# Patient Record
Sex: Female | Born: 1950 | Race: White | Hispanic: No | Marital: Married | Smoking: Never smoker
Health system: Southern US, Community
[De-identification: ages and names within clinical notes are randomized; demographics above are authoritative.]

## PROBLEM LIST (undated history)

## (undated) DIAGNOSIS — I341 Nonrheumatic mitral (valve) prolapse: Secondary | ICD-10-CM

## (undated) DIAGNOSIS — E785 Hyperlipidemia, unspecified: Secondary | ICD-10-CM

## (undated) DIAGNOSIS — I340 Nonrheumatic mitral (valve) insufficiency: Secondary | ICD-10-CM

## (undated) DIAGNOSIS — I471 Supraventricular tachycardia, unspecified: Secondary | ICD-10-CM

## (undated) HISTORY — DX: Nonrheumatic mitral (valve) insufficiency: I34.0

## (undated) HISTORY — DX: Nonrheumatic mitral (valve) prolapse: I34.1

## (undated) HISTORY — DX: Hyperlipidemia, unspecified: E78.5

## (undated) HISTORY — DX: Supraventricular tachycardia, unspecified: I47.10

## (undated) HISTORY — DX: Supraventricular tachycardia: I47.1

---

## 1998-08-19 ENCOUNTER — Other Ambulatory Visit: Admission: RE | Admit: 1998-08-19 | Discharge: 1998-08-19 | Payer: Self-pay | Admitting: Obstetrics and Gynecology

## 1999-10-03 ENCOUNTER — Other Ambulatory Visit: Admission: RE | Admit: 1999-10-03 | Discharge: 1999-10-03 | Payer: Self-pay | Admitting: Obstetrics and Gynecology

## 2000-10-15 ENCOUNTER — Other Ambulatory Visit: Admission: RE | Admit: 2000-10-15 | Discharge: 2000-10-15 | Payer: Self-pay | Admitting: Obstetrics and Gynecology

## 2000-10-18 ENCOUNTER — Other Ambulatory Visit: Admission: RE | Admit: 2000-10-18 | Discharge: 2000-10-18 | Payer: Self-pay | Admitting: Obstetrics and Gynecology

## 2000-10-18 ENCOUNTER — Encounter (INDEPENDENT_AMBULATORY_CARE_PROVIDER_SITE_OTHER): Payer: Self-pay | Admitting: Specialist

## 2000-10-25 ENCOUNTER — Encounter: Payer: Self-pay | Admitting: Obstetrics and Gynecology

## 2000-10-25 ENCOUNTER — Encounter: Admission: RE | Admit: 2000-10-25 | Discharge: 2000-10-25 | Payer: Self-pay | Admitting: Obstetrics and Gynecology

## 2001-11-10 ENCOUNTER — Other Ambulatory Visit: Admission: RE | Admit: 2001-11-10 | Discharge: 2001-11-10 | Payer: Self-pay | Admitting: Obstetrics and Gynecology

## 2002-12-13 ENCOUNTER — Other Ambulatory Visit: Admission: RE | Admit: 2002-12-13 | Discharge: 2002-12-13 | Payer: Self-pay | Admitting: Obstetrics and Gynecology

## 2003-12-27 ENCOUNTER — Other Ambulatory Visit: Admission: RE | Admit: 2003-12-27 | Discharge: 2003-12-27 | Payer: Self-pay | Admitting: Obstetrics and Gynecology

## 2004-04-09 ENCOUNTER — Ambulatory Visit: Payer: Self-pay | Admitting: *Deleted

## 2004-12-24 ENCOUNTER — Ambulatory Visit: Payer: Self-pay | Admitting: *Deleted

## 2005-02-04 ENCOUNTER — Other Ambulatory Visit: Admission: RE | Admit: 2005-02-04 | Discharge: 2005-02-04 | Payer: Self-pay | Admitting: Obstetrics and Gynecology

## 2005-05-13 ENCOUNTER — Ambulatory Visit: Payer: Self-pay | Admitting: *Deleted

## 2005-06-03 ENCOUNTER — Ambulatory Visit: Payer: Self-pay

## 2005-06-03 ENCOUNTER — Ambulatory Visit: Payer: Self-pay | Admitting: *Deleted

## 2005-06-03 ENCOUNTER — Encounter: Payer: Self-pay | Admitting: Cardiology

## 2006-01-15 ENCOUNTER — Ambulatory Visit: Payer: Self-pay | Admitting: *Deleted

## 2006-07-16 ENCOUNTER — Ambulatory Visit: Payer: Self-pay | Admitting: *Deleted

## 2006-07-16 LAB — CONVERTED CEMR LAB: Total CK: 305 units/L (ref 7–177)

## 2006-09-02 ENCOUNTER — Ambulatory Visit: Payer: Self-pay | Admitting: Cardiology

## 2006-10-08 ENCOUNTER — Ambulatory Visit: Payer: Self-pay | Admitting: *Deleted

## 2006-10-08 LAB — CONVERTED CEMR LAB
ALT: 21 units/L (ref 0–40)
AST: 25 units/L (ref 0–37)
Albumin: 3.7 g/dL (ref 3.5–5.2)
Alkaline Phosphatase: 49 units/L (ref 39–117)
Bilirubin, Direct: 0.1 mg/dL (ref 0.0–0.3)
Cholesterol: 164 mg/dL (ref 0–200)
HDL: 65.2 mg/dL (ref >39.0)
LDL Cholesterol: 82 mg/dL (ref 0–99)
Total Bilirubin: 0.8 mg/dL (ref 0.3–1.2)
Total CHOL/HDL Ratio: 2.5
Total CK: 249 units/L (ref 7–177)
Total Protein: 6.4 g/dL (ref 6.0–8.3)
Triglycerides: 83 mg/dL (ref 0–149)
VLDL: 17 mg/dL (ref 0–40)

## 2006-10-14 ENCOUNTER — Ambulatory Visit: Payer: Self-pay | Admitting: Cardiology

## 2006-10-19 ENCOUNTER — Ambulatory Visit: Payer: Self-pay | Admitting: *Deleted

## 2007-01-10 ENCOUNTER — Ambulatory Visit (HOSPITAL_COMMUNITY): Admission: RE | Admit: 2007-01-10 | Discharge: 2007-01-10 | Payer: Self-pay | Admitting: Gastroenterology

## 2007-02-11 ENCOUNTER — Ambulatory Visit: Payer: Self-pay | Admitting: *Deleted

## 2007-02-11 LAB — CONVERTED CEMR LAB
ALT: 23 units/L (ref 0–35)
AST: 27 units/L (ref 0–37)
Albumin: 3.7 g/dL (ref 3.5–5.2)
Alkaline Phosphatase: 54 units/L (ref 39–117)
Bilirubin, Direct: 0.1 mg/dL (ref 0.0–0.3)
Cholesterol: 151 mg/dL (ref 0–200)
HDL: 65 mg/dL (ref >39.0)
LDL Cholesterol: 74 mg/dL (ref 0–99)
Total Bilirubin: 0.8 mg/dL (ref 0.3–1.2)
Total CHOL/HDL Ratio: 2.3
Total CK: 326 units/L (ref 7–177)
Total Protein: 6.6 g/dL (ref 6.0–8.3)
Triglycerides: 58 mg/dL (ref 0–149)
VLDL: 12 mg/dL (ref 0–40)

## 2007-02-17 ENCOUNTER — Ambulatory Visit: Payer: Self-pay | Admitting: Cardiology

## 2007-10-14 ENCOUNTER — Ambulatory Visit: Payer: Self-pay | Admitting: Cardiology

## 2007-10-14 LAB — CONVERTED CEMR LAB
ALT: 19 units/L (ref 0–35)
AST: 25 units/L (ref 0–37)
Albumin: 3.7 g/dL (ref 3.5–5.2)
Alkaline Phosphatase: 56 units/L (ref 39–117)
Bilirubin, Direct: 0.1 mg/dL (ref 0.0–0.3)
Cholesterol: 169 mg/dL (ref 0–200)
HDL: 71.6 mg/dL (ref >39.0)
LDL Cholesterol: 84 mg/dL (ref 0–99)
Total Bilirubin: 0.9 mg/dL (ref 0.3–1.2)
Total CHOL/HDL Ratio: 2.4
Total Protein: 6.6 g/dL (ref 6.0–8.3)
Triglycerides: 66 mg/dL (ref 0–149)
VLDL: 13 mg/dL (ref 0–40)

## 2007-10-19 ENCOUNTER — Ambulatory Visit: Payer: Self-pay | Admitting: Cardiology

## 2008-07-02 DIAGNOSIS — I080 Rheumatic disorders of both mitral and aortic valves: Secondary | ICD-10-CM

## 2008-07-02 DIAGNOSIS — E785 Hyperlipidemia, unspecified: Secondary | ICD-10-CM

## 2008-07-02 DIAGNOSIS — I471 Supraventricular tachycardia: Secondary | ICD-10-CM

## 2008-10-02 ENCOUNTER — Telehealth: Payer: Self-pay | Admitting: Cardiology

## 2008-10-19 ENCOUNTER — Ambulatory Visit: Payer: Self-pay | Admitting: Cardiology

## 2008-10-19 LAB — CONVERTED CEMR LAB
ALT: 25 units/L (ref 0–35)
AST: 25 units/L (ref 0–37)
Albumin: 3.7 g/dL (ref 3.5–5.2)
Alkaline Phosphatase: 60 units/L (ref 39–117)
Bilirubin, Direct: 0.2 mg/dL (ref 0.0–0.3)
Cholesterol: 164 mg/dL (ref 0–200)
HDL: 73.6 mg/dL (ref >39.00)
LDL Cholesterol: 80 mg/dL (ref 0–99)
Total Bilirubin: 0.9 mg/dL (ref 0.3–1.2)
Total CHOL/HDL Ratio: 2
Total Protein: 6.6 g/dL (ref 6.0–8.3)
Triglycerides: 54 mg/dL (ref 0.0–149.0)
VLDL: 10.8 mg/dL (ref 0.0–40.0)

## 2008-10-22 ENCOUNTER — Encounter (INDEPENDENT_AMBULATORY_CARE_PROVIDER_SITE_OTHER): Payer: Self-pay | Admitting: *Deleted

## 2008-11-21 ENCOUNTER — Ambulatory Visit: Payer: Self-pay | Admitting: Cardiology

## 2008-11-21 ENCOUNTER — Encounter (INDEPENDENT_AMBULATORY_CARE_PROVIDER_SITE_OTHER): Payer: Self-pay | Admitting: *Deleted

## 2008-11-21 ENCOUNTER — Encounter: Payer: Self-pay | Admitting: Cardiology

## 2008-11-21 DIAGNOSIS — Z8679 Personal history of other diseases of the circulatory system: Secondary | ICD-10-CM

## 2009-11-14 ENCOUNTER — Ambulatory Visit: Payer: Self-pay | Admitting: Cardiology

## 2009-11-18 ENCOUNTER — Encounter (INDEPENDENT_AMBULATORY_CARE_PROVIDER_SITE_OTHER): Payer: Self-pay | Admitting: *Deleted

## 2009-11-18 LAB — CONVERTED CEMR LAB
ALT: 24 units/L (ref 0–35)
AST: 27 units/L (ref 0–37)
Albumin: 4 g/dL (ref 3.5–5.2)
Alkaline Phosphatase: 59 units/L (ref 39–117)
Bilirubin, Direct: 0.1 mg/dL (ref 0.0–0.3)
Cholesterol: 172 mg/dL (ref 0–200)
HDL: 75.4 mg/dL (ref >39.00)
LDL Cholesterol: 78 mg/dL (ref 0–99)
Total Bilirubin: 0.8 mg/dL (ref 0.3–1.2)
Total CHOL/HDL Ratio: 2
Total Protein: 6.4 g/dL (ref 6.0–8.3)
Triglycerides: 93 mg/dL (ref 0.0–149.0)
VLDL: 18.6 mg/dL (ref 0.0–40.0)

## 2009-11-20 ENCOUNTER — Encounter: Payer: Self-pay | Admitting: Cardiology

## 2009-11-20 ENCOUNTER — Ambulatory Visit: Payer: Self-pay | Admitting: Cardiology

## 2010-07-08 NOTE — Assessment & Plan Note (Signed)
Summary: Holly Pratt   Visit Type:  1 year follow up  CC:  irregular heart beat sometimes.  History of Present Illness: Holly Pratt is a very pleasant  female who  has a history of question of mitral valve prolapse, as well as supraventricular tachycardia that was diagnosed in the 1970s.  She did have her last echocardiogram performed on June 03, 2005.  Her LV function was normal.  There was no mitral valve prolapse, but there was mild mitral regurgitation.  The left atrium is mildly dilated.  There was trivial tricuspid regurgitation. She apparently had a nonischemic stress Myoview performed in 1999. I last saw her in June of 2010. Since then she is doing well from a symptomatic standpoint. There is no dyspnea on exertion, orthopnea, PND, pedal edema, exertional chest pain or syncope. She does continue to have occasional palpitations that are described as a skip. They're nonsustained. The longest episode was 5 seconds. There is no associated symptoms and no syncope.They are not particularly bothersome nor are they any worse since I last saw her.  Current Medications (verified): 1)  Propranolol Hcl 10 Mg Tabs (Propranolol Hcl) .... Take 1 Tablet By Mouth Two Times A Day 2)  Zocor 40 Mg Tabs (Simvastatin) .... Take 1 Tablet By Mouth Once A Day 3)  Fish Oil 1200 Mg Caps (Omega-3 Fatty Acids) .... Take 1 Capsule By Mouth Once A Day 4)  Aspirin 81 Mg Tbec (Aspirin) .... Take One Tablet By Mouth Daily 5)  Calcium Carbonate 600 Mg Tabs (Calcium Carbonate) .... Take 1 Tablet By Mouth Two Times A Day  Allergies (verified): No Known Drug Allergies  Past History:  Past Medical History: HYPERLIPIDEMIA-MIXED (ICD-272.4) MITRAL REGURGITATION, MILD (ICD-396.3) SVT (ICD-427.0) mitral valve prolapse    Social History: Reviewed history from 07/02/2008 and no changes required. Full Time Married  Tobacco Use - No.  Alcohol Use - yes  Review of Systems       Some hand arthralgias but  no fevers or chills, productive cough, hemoptysis, dysphasia, odynophagia, melena, hematochezia, dysuria, hematuria, rash, seizure activity, orthopnea, PND, pedal edema, claudication. Remaining systems are negative.   Vital Signs:  Patient profile:   60 year old female Height:      64 inches Weight:      171.25 pounds BMI:     29.50 Pulse rate:   61 / minute Pulse rhythm:   regular Resp:     18 per minute BP sitting:   138 / 74  (right arm) Cuff size:   large  Vitals Entered By: Holly Pratt (November 20, 2009 1:55 PM)  Physical Exam  General:  Well-developed well-nourished in no acute distress.  Skin is warm and dry.  HEENT is normal.  Neck is supple. No thyromegaly.  Chest is clear to auscultation with normal expansion.  Cardiovascular exam is regular rate and rhythm.  Abdominal exam nontender or distended. No masses palpated. Extremities show no edema. neuro grossly intact    EKG  Procedure date:  11/20/2009  Findings:      Normal sinus rhythm at a rate of 64. Axis normal. No significant ST changes.  Impression & Recommendations:  Problem # 1:  SVT/ PSVT/ PAT (ICD-427.0) Occasional brief skips but no sustained palpitations. Continue propranolol. This dose could be increased in the future if her palpitations worsen. If she has sustained arrhythmias future then we could refer her to EP for consideration of ablation. Her updated medication list for this problem includes:  Propranolol Hcl 10 Mg Tabs (Propranolol hcl) .Marland Kitchen... Take 1 tablet by mouth two times a day    Aspirin 81 Mg Tbec (Aspirin) .Marland Kitchen... Take one tablet by mouth daily  Problem # 2:  HYPERLIPIDEMIA-MIXED (ICD-272.4) Continue statin. Recent lipids and liver outstanding. Her updated medication list for this problem includes:    Zocor 40 Mg Tabs (Simvastatin) .Marland Kitchen... Take 1 tablet by mouth once a day  Problem # 3:  MITRAL REGURGITATION, MILD (ICD-396.3) Last echocardiogram showed only mild mitral regurgitation  and I did not hear a significant murmur on examination. No mitral valve prolapse on last echocardiogram. No further workup at this time.  Patient Instructions: 1)  Your physician recommends that you schedule a follow-up appointment in: ONE YEAR

## 2010-07-08 NOTE — Letter (Signed)
Summary: [facility] - Lipid  [facility], Main Office  [address]   [place], [place] [address]   Phone: [phone]  Fax: [phone]     November 18, 2009 MRN: [id number]   Holly Pratt [address] [place], [place]  [address]   Dear [name],  We have reviewed your cholesterol results.  They are as follows:     Total Cholesterol:    172 (Desirable: less than 200)       HDL  Cholesterol:     75.40  (Desirable: greater than 40 for men and 50 for women)       LDL Cholesterol:       78  (Desirable: less than 100 for low risk and less than 70 for moderate to high risk)       Triglycerides:       93.0  (Desirable: less than 150)  Our recommendations include:These numbers look good. Continue on the same medicine. Liver function is normal. Take care, Dr. Darel Hong.    Call our office at the number listed above if you have any questions.  Lowering your LDL cholesterol is important, but it is only one of a large number of "risk factors" that may indicate that you are at risk for heart disease, stroke or other complications of hardening of the arteries.  Other risk factors include:   A.  Cigarette Smoking* B.  High Blood Pressure* C.  Obesity* D.   Low HDL Cholesterol (see yours above)* E.   Diabetes Mellitus (higher risk if your is uncontrolled) F.  Family history of premature heart disease G.  Previous history of stroke or cardiovascular disease    *These are risk factors YOU HAVE CONTROL OVER.  For more information, visit .  There is now evidence that lowering the TOTAL CHOLESTEROL AND LDL CHOLESTEROL can reduce the risk of heart disease.  The American Heart Association recommends the following guidelines for the treatment of elevated cholesterol:  1.  If there is now current heart disease and less than two risk factors, TOTAL CHOLESTEROL should be less than 200 and LDL CHOLESTEROL should be less than 100. 2.  If there is current heart disease  or two or more risk factors, TOTAL CHOLESTEROL should be less than 200 and LDL CHOLESTEROL should be less than 70.  A diet low in cholesterol, saturated fat, and calories is the cornerstone of treatment for elevated cholesterol.  Cessation of smoking and exercise are also important in the management of elevated cholesterol and preventing vascular disease.  Studies have shown that 30 to 60 minutes of physical activity most days can help lower blood pressure, lower cholesterol, and keep your weight at a healthy level.  Drug therapy is used when cholesterol levels do not respond to therapeutic lifestyle changes (smoking cessation, diet, and exercise) and remains unacceptably high.  If medication is started, it is important to have you levels checked periodically to evaluate the need for further treatment options.  Thank you,    [employer] Team

## 2010-10-21 NOTE — Op Note (Signed)
NAMETANGIE, STAY             ACCOUNT NO.:  [id number]   MEDICAL RECORD NO.:  [id number]          PATIENT TYPE:  AMB   LOCATION:  ENDO                         FACILITY:  [facility]   PHYSICIAN:  James L. Malon Kindle., M.D.DATE OF BIRTH:  May 06, 1951   DATE OF PROCEDURE:  DATE OF DISCHARGE:                               OPERATIVE REPORT   PROCEDURE:  Colonoscopy.   MEDICATIONS:  Fentanyl 75 mg, Versed 7.5 mg.   SCOPE:  __________ Pediatric colonoscope.   INDICATIONS:  Family history of colon cancer.   DESCRIPTION OF PROCEDURE:  The procedure had been explained to the  patient and consent obtained.  In the left lateral decubitus position,  the __________  scope was inserted and advanced.  __________ the cecum  reached.  The ileocecal valve and appendiceal orifice was seen.  The  scope was withdrawn to the cecum.  Ascending, transverse, descending,  sigmoid colon was seen well and no polyp was seen.  No diverticulosis.  The scope was withdrawn in the rectum and retroflexed. It was normal.  The patient tolerated the procedure well.  There were no immediate complications.   ASSESSMENT:  Family history of colon cancer with negative colonoscopy at  this time.   PLAN:  Routine screening with result of stool hemoccult  in 5 years on  annual basis.  Repeat colonoscopy in 10 years.           ______________________________  Llana Aliment Malon Kindle., M.D.     Waldron Session  D:  01/10/2007  T:  01/10/2007  Job:  [id number]

## 2010-10-21 NOTE — Assessment & Plan Note (Signed)
[facility]                            CARDIOLOGY OFFICE NOTE   NAME:[name], Holly Pratt                    MRN:          [id number]  DATE:10/19/2007                            DOB:          October 04, 1950    Holly Pratt is a very pleasant 60 year old female who had been  followed by Holly Pratt for years.  She has a history of question of  mitral valve prolapse, as well as supraventricular tachycardia that was  diagnosed in the 1970s.  She did have her last echocardiogram performed  on June 03, 2005.  Her LV function was normal.  There was no mitral  valve prolapse, but there was mild mitral regurgitation.  The left  atrium is mildly dilated.  There was trivial tricuspid regurgitation.  She apparently had a nonischemic stress Myoview performed in 1999.  Since she was last seen, she continues to have occasional palpitations.  They are more frequent recently.  They are described as her heart  racing.  They are approximately one minute in duration and resolve  spontaneously.  She feels that they have increased recently secondary to  stress.  There is mild dyspnea, but there is no presyncope, syncope or  chest pain.  These are happening approximately twice per week.  She  otherwise denies any dyspnea on exertion, orthopnea, PND, pedal edema or  exertional chest pain.   MEDICATIONS:  1. Propranolol 10 mg p.o. b.i.d.  2. Zocor 40 mg p.o. daily.  3. Fosamax.  4. Fish oil.  5. Aspirin 81 mg p.o. daily.  6. Calcium.   PHYSICAL EXAMINATION:  VITAL SIGNS:  Blood pressure of 130/71.  She  weighs 168 pounds.  Her pulse is 60.  HEENT:  Normal.  NECK:  Supple with no bruits.  CHEST:  Clear.  CARDIOVASCULAR:  Regular rhythm.  ABDOMEN:  No tenderness.  EXTREMITIES:  No edema.   ELECTROCARDIOGRAM:  Sinus rhythm at a rate of 60.  The axis is normal.  There is no delta wave noted.  Her p.r.n. interval is also normal.  There are no ST changes noted.   She  also had lipids drawn on Oct 14, 2007.  Her liver functions were  normal, and her total cholesterol was 169 with an a LDL of 84.   DIAGNOSES:  1. History of supraventricular tachycardia - I reviewed all of the      options with Holly Pratt today.  Her symptoms have increased in      frequency, but she attributes this to stress.  I have explained      that she could potentially be cured with an ablation.  However, she      would like to continue with medical therapy for now.  If her      symptoms worsen in the future or are more prolonged, then we can      consider it at that point.  I also discussed potentially increasing      her propranolol, but she declined, stating that the present      medications have controlled her for some time.  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  She will contact us if she changes her      mind.  2. History of mitral valve prolapse - her Pratt recent echocardiogram      did not suggest mitral valve prolapse, and there was only mild      mitral regurgitation.  3. Hyperlipidemia - she will continue on her Zocor, and her lipids      appear to be outstanding.   FOLLOWUP:  We will see her back in 1 year.     Holly Pratt Holly Som, MD, [facility]  Electronically Signed    BSC/MedQ  DD: 10/19/2007  DT: 10/19/2007  Job #: [id number]   cc:   Holly Pratt Record

## 2010-10-21 NOTE — Assessment & Plan Note (Signed)
[facility]                               LIPID CLINIC NOTE   NAME:COTTRELLJanace, Decker                    MRN:          [id number]  DATE:10/14/2006                            DOB:          Dec 03, 1950    The patient is seen in the Lipid Clinic for further evaluation and  medication titration associated with her hyperlipidemia.  The patient  has been feeling and doing well overall.  She was evaluated here due to  an increase in her CK.  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  She was evaluated by Rheumatologist who  found no reason or issue associated with this issue.  She currently is  compliant with her fish oil 2 gram daily therapy and her Zocor 40 mg  daily.  She follows a low-fat, low-cholesterol diet and occasionally  walks in the evening for exercise.   PAST MEDICAL HISTORY:  1. Hypertension.  2. Hyperlipidemia.  3. Elevated CK.   PHYSICAL EXAMINATION:  Weight today in the office is 167-1/2 pounds,  blood pressure is 135/80, heart rate is 64.   Labs on Oct 13, 2006 reveal total cholesterol 164, triglyceride 83, HDL  65.2, LDL 82.  LFTs are within normal limits.  CK is slightly elevated  at 249 though improved from February 8 value of 305.   ASSESSMENT:  The patient has a stable lipid panel that reveals primary,  secondary, and tertiary goals met for lipid lowering therapy.  She will  continue on her same medications and continue her low-fat diet.  She  will work to increase her walking as much as possible.  We will have her  follow up per Dr. Marcha Solders request in 4 months.      Shelby Dubin, PharmD, BCPS, CPP  Electronically Signed      Rollene Rotunda, MD, [facility]  Electronically Signed   MP/MedQ  DD: 10/29/2006  DT: 10/29/2006  Job #: [id number]   cc:   Cecil Cranker, MD, [facility] Record

## 2010-10-21 NOTE — Assessment & Plan Note (Signed)
[facility]                               LIPID CLINIC NOTE   NAME:COTTRELLLasean, Rahming                    MRN:          [id number]  DATE:02/17/2007                            DOB:          Jul 04, 1950    Ms. Trevizo comes in with her second visit with [place] in the lipid clinic.  She has been tolerating her current cholesterol medicines, which  includes Zocor 40 mg daily and fish oil capsules, two daily.  She denies  any major muscle aches and pains.  Her other medications that have not  changed today include propranolol, Fosamax, calcium supplements and 81-  mg aspirin.   EXAMINATION:  VITAL SIGNS:  Her weight is 170 pounds, blood pressure is  130/70, heart rate is 68.   LABORATORY DATA:  Includes low cholesterol 151, triglycerides 58, HDL  65, LDL 74, liver function tests are within normal limits.  Her creatine  kinase is 326.   ASSESSMENT:  Ms. Cutshaw comes in today feeling just fine.  She has  continued to follow a heart healthy diet and exercise as much as she  can.  Her triglycerides, HDL and LDL remain at goals.  Her creatine  kinase has been in the low 300s.  Also, when it was checked in February  of 2007.  This was a minor elevation of CK and to me it does not  indicate any myopathies from any Statin-induced myopathy.  At the  patient's request, we will discontinue following her here in the lipid  clinic after this visit.  She is going to continue her current Zocor 40  mg daily and a fish oil supplement and follow up with her regular  doctor, as needed.     Charolotte Eke, PharmD  Electronically Signed    TP/MedQ  DD: 02/17/2007  DT: 02/18/2007  Job #: [id number]   cc:   Leonette Most Record

## 2010-10-21 NOTE — Letter (Signed)
Oct 19, 2006    [facility] Record  [address]  [place], [place] [address]   RE:  Holly, Pratt  MRN:  [id number]  /  DOB:  1950/06/26   Dear Leonette Most:   It was a pleasure to see your nice patient, Holly Pratt in followup  on Oct 19, 2006.  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  Recently, we have not heard a click or murmur, and the echo revealed no  mitral valve prolapse, though it did reveal mild mitral regurgitation.  The patient is getting along quite well.  She has had some recent pain  in her feet with elevated creatinine kinase and is now being followed in  the lipid clinic.  She has had no recent palpitations or chest  discomfort, shortness of breath.   MEDICATIONS:  1. Propranolol 10 mg b.i.d.  2. Zocor 40.  3. Fosamax 70.  4. Aspirin 81.   PHYSICAL EXAM:  Blood pressure 130/82, pulse 66, normal sinus rhythm.  GENERAL APPEARANCE:  Normal.  JVPs not elevated, carotid pulses palpable and equal without bruits.  LUNGS:  Clear.  CARDIAC:  No murmur, click, or rub.  ABDOMEN:  Unremarkable.  EXTREMITIES:  Normal.   IMPRESSION:  1. History of mitral valve prolapse and supraventricular tachycardia      with no documentation of mitral valve prolapse at this time and no      recent supraventricular tachycardia.  2. Hyperlipidemia on therapy, followed in the lipid clinic.   I have suggested the patient see Dr. Jens Som in followup in our  [facility].   She will also be seen in the lipid clinic in 4 months.   The EKG today is normal.  Thank you for the opportunity to share in this  nice patient's care.    Sincerely,      E. Graceann Congress, MD, [facility]  Electronically Signed    EJL/MedQ  DD: 10/19/2006  DT: 10/19/2006  Job #: [id number]

## 2010-10-24 NOTE — Assessment & Plan Note (Signed)
[facility]                               LIPID CLINIC NOTE   NAME:COTTRELLNyajah, Hyson                    MRN:          [id number]  DATE:09/02/2006                            DOB:          09-Oct-1950    [name] comes into the lipid clinic today for the first time.  She  was referred to [place] for ongoing leg pain and a slightly elevated  creatinine kinase, on Zocor.  She has seen Dr. Glennon Hamilton in the past  for a history of mitral valve prolapse and supraventricular tachycardia.   CURRENT MEDICATIONS:  1. Zocor 40 mg daily.  2. Propranolol 5.  3. Calcium.  4. Fish oil.   ALLERGIES:  SHE HAS NO KNOWN DRUG ALLERGIES.   SOCIAL HISTORY:  Negative.  Does not smoke.  Drinks one alcoholic drink  per day.  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  Father had congestive heart  failure.  Mother had lupus, osteoarthritis and congestive heart failure  late in life.   PHYSICAL EXAMINATION:  VITAL SIGNS:  Weight of 168, blood pressure  125/75, heart rate of 66.   LABORATORY DATA:  Includes lipid panel from August 2007, total  cholesterol 176, triglyceride 69, HDL 71.3, LDL 91.  Liver function  tests were within normal limits, with creatinine kinase drawn February  2008 of 305.   ASSESSMENT:  Holly Pratt is doing just fine.  She has been tolerating  Zocor.  She has been having ongoing foot and ankle pain.  This she  describes as, at the end of a long day she has pain in her right foot  mostly within the ankles.  She has seen the podiatrist for the problem,  has been diagnosed with a bunion.  She denies any major muscle group,  aches and pains.  The patient has been about the same in severity and  frequency for quite some time.  Triglycerides are at goal of less than  150.  With her most recent lipid panel, her triglycerides were at goal  of less than 150.  HDL was at goal of greater than 40.  The LDL was at  goal of less than 100.  Her creatinine kinase was above  the normal  range, but her clinical signs of Statin-induced myopathy are not  convincing.   PLAN:  I am going to continue with the same medications.  We will follow  up with her in 6 weeks, check a lipid panel, liver panel and a  creatinine kinase at that time.  She is also in the  meantime, going to make an appointment with Dr. Corinda Gubler for an annual  visit with him.  She was instructed to give [place] a call with any problems,  questions in the meantime.      Charolotte Eke, PharmD  Electronically Signed      Rollene Rotunda, MD, [facility]  Electronically Signed   TP/MedQ  DD: 09/02/2006  DT: 09/02/2006  Job #: [id number]

## 2010-11-11 ENCOUNTER — Telehealth: Payer: Self-pay | Admitting: Cardiology

## 2010-11-11 DIAGNOSIS — E7800 Pure hypercholesterolemia, unspecified: Secondary | ICD-10-CM

## 2010-11-11 DIAGNOSIS — Z79899 Other long term (current) drug therapy: Secondary | ICD-10-CM

## 2010-11-11 NOTE — Telephone Encounter (Signed)
Pt calling to see if lab work needed prior to or at her next visit with crenshaw, if so wants order sent to the [address] office

## 2010-11-12 NOTE — Telephone Encounter (Signed)
Spoke with pt, she has not had her chol or liver checked since last visit. Orders placed for [facility] lab draw Holly Pratt

## 2010-11-18 ENCOUNTER — Other Ambulatory Visit (INDEPENDENT_AMBULATORY_CARE_PROVIDER_SITE_OTHER): Payer: Self-pay

## 2010-11-18 DIAGNOSIS — E7800 Pure hypercholesterolemia, unspecified: Secondary | ICD-10-CM

## 2010-11-18 DIAGNOSIS — Z79899 Other long term (current) drug therapy: Secondary | ICD-10-CM

## 2010-11-18 LAB — HEPATIC FUNCTION PANEL
ALT: 22 U/L (ref 0–35)
Total Bilirubin: 0.7 mg/dL (ref 0.3–1.2)

## 2010-11-18 LAB — LIPID PANEL
HDL: 81.2 mg/dL (ref >39.00)
LDL Cholesterol: 88 mg/dL (ref 0–99)
Total CHOL/HDL Ratio: 2
VLDL: 17.2 mg/dL (ref 0.0–40.0)

## 2010-11-19 ENCOUNTER — Telehealth: Payer: Self-pay | Admitting: Cardiology

## 2010-11-19 NOTE — Telephone Encounter (Signed)
PT AWARE OF LAB RESULTS. SEE LABS/CY

## 2010-11-19 NOTE — Telephone Encounter (Signed)
Pt rtn your call

## 2010-12-02 ENCOUNTER — Encounter: Payer: Self-pay | Admitting: Cardiology

## 2010-12-03 ENCOUNTER — Ambulatory Visit (INDEPENDENT_AMBULATORY_CARE_PROVIDER_SITE_OTHER): Payer: BC Managed Care – PPO | Admitting: Cardiology

## 2010-12-03 ENCOUNTER — Encounter: Payer: Self-pay | Admitting: Cardiology

## 2010-12-03 DIAGNOSIS — E785 Hyperlipidemia, unspecified: Secondary | ICD-10-CM

## 2010-12-03 DIAGNOSIS — R002 Palpitations: Secondary | ICD-10-CM

## 2010-12-03 DIAGNOSIS — I471 Supraventricular tachycardia: Secondary | ICD-10-CM

## 2010-12-03 DIAGNOSIS — Z8679 Personal history of other diseases of the circulatory system: Secondary | ICD-10-CM

## 2010-12-03 DIAGNOSIS — I080 Rheumatic disorders of both mitral and aortic valves: Secondary | ICD-10-CM

## 2010-12-03 DIAGNOSIS — I059 Rheumatic mitral valve disease, unspecified: Secondary | ICD-10-CM

## 2010-12-03 NOTE — Assessment & Plan Note (Signed)
Management per primary care. 

## 2010-12-03 NOTE — Progress Notes (Signed)
HPI: Mrs. Pantoja is a very pleasant  female who  has a history of question of mitral valve prolapse, as well as supraventricular tachycardia that was diagnosed in the 1970s. She did have her last echocardiogram performed on June 03, 2005.  Her LV function was normal.  There was no mitral valve prolapse, but there was mild mitral regurgitation.  The left atrium is mildly dilated.  There was trivial tricuspid regurgitation. She apparently had a nonischemic stress Myoview performed in 1999. I last saw her in June of 2011. Since then she does not have dyspnea on exertion, orthopnea, PND, pedal edema, syncope or chest pain. She occasionally has palpitations. They are described as a skipped and a brief flutter. They're not sustained. No associated symptoms. She has not had prolonged palpitations.  Current Outpatient Prescriptions  Medication Sig Dispense Refill  . aspirin 81 MG EC tablet Take 81 mg by mouth daily.        . calcium carbonate (OS-CAL) 600 MG TABS Take 600 mg by mouth 2 (two) times daily.        . Omega-3 Fatty Acids (FISH OIL) 1200 MG CAPS Take by mouth daily.        . propranolol (INDERAL) 10 MG tablet Take 10 mg by mouth 2 (two) times daily.        . simvastatin (ZOCOR) 40 MG tablet Take 40 mg by mouth at bedtime.           Past Medical History  Diagnosis Date  . Hyperlipidemia     mixed  . Mitral regurgitation   . SVT (supraventricular tachycardia)   . Mitral valve prolapse     No past surgical history on file.  History   Social History  . Marital Status: Married    Spouse Name: N/A    Number of Children: N/A  . Years of Education: N/A   Occupational History  . Not on file.   Social History Main Topics  . Smoking status: Never Smoker   . Smokeless tobacco: Not on file  . Alcohol Use: Yes  . Drug Use:   . Sexually Active:    Other Topics Concern  . Not on file   Social History Narrative  . No narrative on file    ROS: no fevers or chills, productive  cough, hemoptysis, dysphasia, odynophagia, melena, hematochezia, dysuria, hematuria, rash, seizure activity, orthopnea, PND, pedal edema, claudication. Remaining systems are negative.  Physical Exam: Well-developed well-nourished in no acute distress.  Skin is warm and dry.  HEENT is normal.  Neck is supple. No thyromegaly.  Chest is clear to auscultation with normal expansion.  Cardiovascular exam is regular rate and rhythm.  Abdominal exam nontender or distended. No masses palpated. Extremities show no edema. neuro grossly intact  ECG Normal sinus rhythm at a rate of 62. No ST changes.

## 2010-12-03 NOTE — Assessment & Plan Note (Signed)
Patient continues to have occasional palpitations but they are unchanged compared to previous. Continue present dose of beta blocker. We will increase this in the future if her palpitations worsen.

## 2010-12-03 NOTE — Assessment & Plan Note (Signed)
Plan repeat echocardiogram. 

## 2010-12-03 NOTE — Patient Instructions (Signed)

## 2010-12-05 ENCOUNTER — Encounter: Payer: Self-pay | Admitting: *Deleted

## 2010-12-16 ENCOUNTER — Ambulatory Visit (HOSPITAL_COMMUNITY): Payer: BC Managed Care – PPO | Attending: Cardiology

## 2010-12-16 DIAGNOSIS — I059 Rheumatic mitral valve disease, unspecified: Secondary | ICD-10-CM | POA: Insufficient documentation

## 2010-12-16 DIAGNOSIS — E785 Hyperlipidemia, unspecified: Secondary | ICD-10-CM | POA: Insufficient documentation

## 2010-12-16 DIAGNOSIS — I379 Nonrheumatic pulmonary valve disorder, unspecified: Secondary | ICD-10-CM | POA: Insufficient documentation

## 2010-12-16 DIAGNOSIS — R002 Palpitations: Secondary | ICD-10-CM | POA: Insufficient documentation

## 2010-12-16 DIAGNOSIS — I079 Rheumatic tricuspid valve disease, unspecified: Secondary | ICD-10-CM | POA: Insufficient documentation

## 2011-06-22 ENCOUNTER — Other Ambulatory Visit: Payer: Self-pay | Admitting: Cardiology

## 2011-08-05 ENCOUNTER — Other Ambulatory Visit: Payer: Self-pay | Admitting: Cardiology

## 2011-12-03 ENCOUNTER — Telehealth: Payer: Self-pay | Admitting: Cardiology

## 2011-12-03 DIAGNOSIS — E7800 Pure hypercholesterolemia, unspecified: Secondary | ICD-10-CM

## 2011-12-03 NOTE — Telephone Encounter (Signed)
Spoke with pt, aware labs ordered

## 2011-12-03 NOTE — Telephone Encounter (Signed)
New msg Pt has appt with Dr Jens Som on [phone] in [place] and wants to get blood work done at [facility]. Please put in orders

## 2011-12-07 ENCOUNTER — Encounter: Payer: Self-pay | Admitting: *Deleted

## 2011-12-07 ENCOUNTER — Other Ambulatory Visit (INDEPENDENT_AMBULATORY_CARE_PROVIDER_SITE_OTHER): Payer: BC Managed Care – PPO

## 2011-12-07 DIAGNOSIS — E7800 Pure hypercholesterolemia, unspecified: Secondary | ICD-10-CM

## 2011-12-07 LAB — HEPATIC FUNCTION PANEL
AST: 24 U/L (ref 0–37)
Albumin: 3.8 g/dL (ref 3.5–5.2)
Alkaline Phosphatase: 58 U/L (ref 39–117)
Bilirubin, Direct: 0.1 mg/dL (ref 0.0–0.3)

## 2011-12-07 LAB — LIPID PANEL
LDL Cholesterol: 55 mg/dL (ref 0–99)
Total CHOL/HDL Ratio: 2
Triglycerides: 71 mg/dL (ref 0.0–149.0)

## 2011-12-16 ENCOUNTER — Ambulatory Visit (INDEPENDENT_AMBULATORY_CARE_PROVIDER_SITE_OTHER): Payer: BC Managed Care – PPO | Admitting: Cardiology

## 2011-12-16 ENCOUNTER — Encounter: Payer: Self-pay | Admitting: Cardiology

## 2011-12-16 VITALS — BP 118/70 | HR 65 | Wt 160.0 lb

## 2011-12-16 DIAGNOSIS — R002 Palpitations: Secondary | ICD-10-CM

## 2011-12-16 DIAGNOSIS — I471 Supraventricular tachycardia, unspecified: Secondary | ICD-10-CM

## 2011-12-16 DIAGNOSIS — Z8679 Personal history of other diseases of the circulatory system: Secondary | ICD-10-CM

## 2011-12-16 DIAGNOSIS — I080 Rheumatic disorders of both mitral and aortic valves: Secondary | ICD-10-CM

## 2011-12-16 DIAGNOSIS — E785 Hyperlipidemia, unspecified: Secondary | ICD-10-CM

## 2011-12-16 NOTE — Assessment & Plan Note (Signed)
No evidence on most recent echo. 

## 2011-12-16 NOTE — Assessment & Plan Note (Signed)
Palpitations appear to be recently well controlled with beta-blockade. We'll continue. LV function normal.

## 2011-12-16 NOTE — Progress Notes (Signed)
   HPI: Mrs. Morden is a very pleasant female who has a history of question of mitral valve prolapse, as well as supraventricular tachycardia that was diagnosed in the 1970s. She did have her last echocardiogram performed in July of 2012. Her LV function was normal. There was no mitral valve prolapse, but there was mild mitral regurgitation. She apparently had a nonischemic stress Myoview performed in 1999. I last saw her in June of 2012. Since then there is no dyspnea on exertion, orthopnea, PND, pedal edema, syncope or exertional chest pain. Occasional brief skips but no sustained palpitations.  Current Outpatient Prescriptions  Medication Sig Dispense Refill  . aspirin 81 MG EC tablet Take 81 mg by mouth daily.        . calcium carbonate (OS-CAL) 600 MG TABS Take 600 mg by mouth 2 (two) times daily.        . Omega-3 Fatty Acids (FISH OIL) 1200 MG CAPS Take by mouth daily.        . propranolol (INDERAL) 10 MG tablet TAKE 1 TABLET TWICE A DAY  60 tablet  12  . simvastatin (ZOCOR) 40 MG tablet TAKE 1 TABLET IN THE EVENING  90 tablet  1     Past Medical History  Diagnosis Date  . Hyperlipidemia     mixed  . Mitral regurgitation   . SVT (supraventricular tachycardia)   . Mitral valve prolapse     No past surgical history on file.  History   Social History  . Marital Status: Married    Spouse Name: N/A    Number of Children: N/A  . Years of Education: N/A   Occupational History  . Not on file.   Social History Main Topics  . Smoking status: Never Smoker   . Smokeless tobacco: Not on file  . Alcohol Use: Yes  . Drug Use:   . Sexually Active:    Other Topics Concern  . Not on file   Social History Narrative  . No narrative on file    ROS: some anxiety over husband's illness but no fevers or chills, productive cough, hemoptysis, dysphasia, odynophagia, melena, hematochezia, dysuria, hematuria, rash, seizure activity, orthopnea, PND, pedal edema, claudication. Remaining  systems are negative.  Physical Exam: Well-developed well-nourished in no acute distress.  Skin is warm and dry.  HEENT is normal.  Neck is supple.  Chest is clear to auscultation with normal expansion.  Cardiovascular exam is regular rate and rhythm.  Abdominal exam nontender or distended. No masses palpated. Extremities show no edema. neuro grossly intact  ECG sinus rhythm at a rate of 65. No significant ST changes.

## 2011-12-16 NOTE — Patient Instructions (Addendum)
Your physician wants you to follow-up in: ONE YEAR WITH DR CRENSHAW IN [place] You will receive a reminder letter in the mail two months in advance. If you don't receive a letter, please call our office to schedule the follow-up appointment.  

## 2011-12-16 NOTE — Assessment & Plan Note (Signed)
Continue statin. 

## 2011-12-16 NOTE — Assessment & Plan Note (Signed)
Continued to be mild on most recent echocardiogram.

## 2011-12-18 ENCOUNTER — Other Ambulatory Visit: Payer: Self-pay | Admitting: Cardiology

## 2012-08-08 ENCOUNTER — Other Ambulatory Visit: Payer: Self-pay | Admitting: Cardiology

## 2012-11-04 ENCOUNTER — Other Ambulatory Visit: Payer: Self-pay | Admitting: Cardiology

## 2012-12-20 ENCOUNTER — Other Ambulatory Visit: Payer: Self-pay | Admitting: *Deleted

## 2012-12-20 ENCOUNTER — Other Ambulatory Visit (INDEPENDENT_AMBULATORY_CARE_PROVIDER_SITE_OTHER): Payer: BC Managed Care – PPO

## 2012-12-20 DIAGNOSIS — E7800 Pure hypercholesterolemia, unspecified: Secondary | ICD-10-CM

## 2012-12-20 DIAGNOSIS — Z79899 Other long term (current) drug therapy: Secondary | ICD-10-CM

## 2012-12-20 LAB — HEPATIC FUNCTION PANEL
Alkaline Phosphatase: 58 U/L (ref 39–117)
Bilirubin, Direct: 0.1 mg/dL (ref 0.0–0.3)
Total Bilirubin: 0.8 mg/dL (ref 0.3–1.2)
Total Protein: 7.3 g/dL (ref 6.0–8.3)

## 2012-12-20 LAB — LIPID PANEL
HDL: 85.7 mg/dL (ref >39.00)
Total CHOL/HDL Ratio: 2
Triglycerides: 85 mg/dL (ref 0.0–149.0)
VLDL: 17 mg/dL (ref 0.0–40.0)

## 2013-01-04 ENCOUNTER — Encounter: Payer: Self-pay | Admitting: Cardiology

## 2013-01-04 ENCOUNTER — Ambulatory Visit (INDEPENDENT_AMBULATORY_CARE_PROVIDER_SITE_OTHER): Payer: BC Managed Care – PPO | Admitting: Cardiology

## 2013-01-04 VITALS — BP 118/80 | HR 69 | Ht 64.0 in | Wt 160.0 lb

## 2013-01-04 DIAGNOSIS — I498 Other specified cardiac arrhythmias: Secondary | ICD-10-CM

## 2013-01-04 DIAGNOSIS — E785 Hyperlipidemia, unspecified: Secondary | ICD-10-CM

## 2013-01-04 DIAGNOSIS — Z8679 Personal history of other diseases of the circulatory system: Secondary | ICD-10-CM

## 2013-01-04 DIAGNOSIS — I471 Supraventricular tachycardia: Secondary | ICD-10-CM

## 2013-01-04 NOTE — Assessment & Plan Note (Signed)
Not noted on most recent echocardiogram.

## 2013-01-04 NOTE — Assessment & Plan Note (Signed)
Continue statin. 

## 2013-01-04 NOTE — Assessment & Plan Note (Signed)
Symptoms are well-controlled with beta-blockade. Will continue. If she has more frequent prolonged bouts in the future we can consider referral for ablation.

## 2013-01-04 NOTE — Patient Instructions (Addendum)
Your physician wants you to follow-up in: ONE YEAR WITH DR CRENSHAW IN [place] You will receive a reminder letter in the mail two months in advance. If you don't receive a letter, please call our office to schedule the follow-up appointment.  

## 2013-01-04 NOTE — Progress Notes (Signed)
   HPI: Holly Pratt is a very pleasant female who has a history of question of mitral valve prolapse, as well as supraventricular tachycardia that was diagnosed in the 1970s for fu. She did have her last echocardiogram performed in July of 2012. Her LV function was normal. There was no mitral valve prolapse, but there was mild mitral regurgitation. She apparently had a nonischemic stress Myoview performed in 1999. I last saw her in July 2013. Since then there is no dyspnea on exertion, orthopnea, PND, pedal edema, syncope or exertional chest pain. Occasional brief skips but no sustained palpitations.   Current Outpatient Prescriptions  Medication Sig Dispense Refill  . aspirin 81 MG EC tablet Take 81 mg by mouth daily.        . calcium carbonate (OS-CAL) 600 MG TABS Take 600 mg by mouth 2 (two) times daily.        . Omega-3 Fatty Acids (FISH OIL) 1200 MG CAPS Take by mouth daily.        . propranolol (INDERAL) 10 MG tablet TAKE 1 TABLET TWICE A DAY  60 tablet  6  . simvastatin (ZOCOR) 40 MG tablet TAKE 1 TABLET IN THE EVENING  30 tablet  12   No current facility-administered medications for this visit.     Past Medical History  Diagnosis Date  . Hyperlipidemia     mixed  . Mitral regurgitation   . SVT (supraventricular tachycardia)   . Mitral valve prolapse     History reviewed. No pertinent past surgical history.  History   Social History  . Marital Status: Married    Spouse Name: N/A    Number of Children: N/A  . Years of Education: N/A   Occupational History  . Not on file.   Social History Main Topics  . Smoking status: Never Smoker   . Smokeless tobacco: Not on file  . Alcohol Use: Yes  . Drug Use:   . Sexually Active:    Other Topics Concern  . Not on file   Social History Narrative  . No narrative on file    ROS: no fevers or chills, productive cough, hemoptysis, dysphasia, odynophagia, melena, hematochezia, dysuria, hematuria, rash, seizure activity,  orthopnea, PND, pedal edema, claudication. Remaining systems are negative.  Physical Exam: Well-developed well-nourished in no acute distress.  Skin is warm and dry.  HEENT is normal.  Neck is supple.  Chest is clear to auscultation with normal expansion.  Cardiovascular exam is regular rate and rhythm.  Abdominal exam nontender or distended. No masses palpated. Extremities show no edema. neuro grossly intact  ECG sinus rhythm at a rate of 69. No ST changes.

## 2013-01-11 ENCOUNTER — Encounter: Payer: Self-pay | Admitting: Cardiology

## 2013-01-11 ENCOUNTER — Encounter: Payer: Self-pay | Admitting: *Deleted

## 2013-03-18 ENCOUNTER — Other Ambulatory Visit: Payer: Self-pay | Admitting: Cardiology

## 2013-04-13 ENCOUNTER — Other Ambulatory Visit: Payer: Self-pay

## 2013-07-04 ENCOUNTER — Other Ambulatory Visit: Payer: Self-pay

## 2013-07-04 MED ORDER — PROPRANOLOL HCL 10 MG PO TABS: ORAL_TABLET | ORAL | Status: DC

## 2013-09-13 ENCOUNTER — Other Ambulatory Visit: Payer: Self-pay | Admitting: Cardiology

## 2013-11-03 ENCOUNTER — Other Ambulatory Visit: Payer: Self-pay | Admitting: Cardiology

## 2013-12-01 ENCOUNTER — Other Ambulatory Visit: Payer: Self-pay | Admitting: Cardiology

## 2013-12-11 ENCOUNTER — Other Ambulatory Visit: Payer: Self-pay | Admitting: Cardiology

## 2013-12-20 ENCOUNTER — Encounter: Payer: Self-pay | Admitting: Cardiology

## 2013-12-20 ENCOUNTER — Ambulatory Visit (INDEPENDENT_AMBULATORY_CARE_PROVIDER_SITE_OTHER): Payer: 59 | Admitting: Cardiology

## 2013-12-20 VITALS — BP 124/86 | HR 74 | Ht 64.0 in | Wt 163.0 lb

## 2013-12-20 DIAGNOSIS — I471 Supraventricular tachycardia: Secondary | ICD-10-CM

## 2013-12-20 DIAGNOSIS — E785 Hyperlipidemia, unspecified: Secondary | ICD-10-CM

## 2013-12-20 NOTE — Patient Instructions (Signed)
Your physician wants you to follow-up in: ONE YEAR WITH DR CRENSHAW You will receive a reminder letter in the mail two months in advance. If you don't receive a letter, please call our office to schedule the follow-up appointment.  

## 2013-12-20 NOTE — Assessment & Plan Note (Addendum)
Continue statin. Lipids and liver monitored by primary care. 

## 2013-12-20 NOTE — Assessment & Plan Note (Signed)
Symptoms are reasonably well controlled. Continue beta blocker. 

## 2013-12-20 NOTE — Assessment & Plan Note (Signed)
Mitral regurgitation is mild on most recent echocardiogram.

## 2013-12-20 NOTE — Progress Notes (Signed)
      HPI: FU history of question of mitral valve prolapse, as well as supraventricular tachycardia that was diagnosed in the 1970s. She did have her last echocardiogram performed in July of 2012. Her LV function was normal. There was no mitral valve prolapse, but there was mild mitral regurgitation. She apparently had a nonischemic stress Myoview performed in 1999. I last saw her in July 2014. Since then there is no dyspnea on exertion, orthopnea, PND, pedal edema, syncope or exertional chest pain. Occasional brief skips but no sustained palpitations.   Current Outpatient Prescriptions  Medication Sig Dispense Refill  . aspirin 81 MG EC tablet Take 81 mg by mouth daily.        . calcium carbonate (OS-CAL) 600 MG TABS Take 600 mg by mouth 2 (two) times daily.        . Omega-3 Fatty Acids (FISH OIL) 1200 MG CAPS Take by mouth daily.        . propranolol (INDERAL) 10 MG tablet TAKE 1 TABLET TWICE A DAY  60 tablet  6  . simvastatin (ZOCOR) 40 MG tablet TAKE 1 TABLET IN THE EVENING  90 tablet  1   No current facility-administered medications for this visit.     Past Medical History  Diagnosis Date  . Hyperlipidemia     mixed  . Mitral regurgitation   . SVT (supraventricular tachycardia)   . Mitral valve prolapse     History reviewed. No pertinent past surgical history.  History   Social History  . Marital Status: Married    Spouse Name: N/A    Number of Children: N/A  . Years of Education: N/A   Occupational History  . Not on file.   Social History Main Topics  . Smoking status: Never Smoker   . Smokeless tobacco: Not on file  . Alcohol Use: Yes  . Drug Use:   . Sexual Activity:    Other Topics Concern  . Not on file   Social History Narrative  . No narrative on file    ROS: no fevers or chills, productive cough, hemoptysis, dysphasia, odynophagia, melena, hematochezia, dysuria, hematuria, rash, seizure activity, orthopnea, PND, pedal edema, claudication. Remaining  systems are negative.  Physical Exam: Well-developed well-nourished in no acute distress.  Skin is warm and dry.  HEENT is normal.  Neck is supple.  Chest is clear to auscultation with normal expansion.  Cardiovascular exam is regular rate and rhythm.  Abdominal exam nontender or distended. No masses palpated. Extremities show no edema. neuro grossly intact  ECG Sinus rhythm at a rate of 74. Nonspecific ST changes.

## 2014-01-05 ENCOUNTER — Other Ambulatory Visit: Payer: Self-pay | Admitting: Cardiology

## 2014-06-07 ENCOUNTER — Other Ambulatory Visit: Payer: Self-pay | Admitting: Cardiology

## 2014-06-07 NOTE — Telephone Encounter (Signed)
E sent to pharm 

## 2014-12-01 ENCOUNTER — Other Ambulatory Visit: Payer: Self-pay | Admitting: Cardiology

## 2014-12-03 ENCOUNTER — Other Ambulatory Visit: Payer: Self-pay

## 2014-12-03 MED ORDER — SIMVASTATIN 40 MG PO TABS: 40.0000 mg | ORAL_TABLET | Freq: Every evening | ORAL | Status: DC

## 2015-01-28 ENCOUNTER — Other Ambulatory Visit: Payer: Self-pay | Admitting: Cardiology

## 2015-01-28 NOTE — Telephone Encounter (Signed)
Rx(s) sent to pharmacy electronically.  

## 2015-02-25 NOTE — Progress Notes (Signed)
      HPI: FU history of question of mitral valve prolapse, as well as supraventricular tachycardia that was diagnosed in the 1970s. She apparently had a nonischemic stress Myoview performed in 1999. She did have her last echocardiogram performed in July of 2012. Her LV function was normal. There was no mitral valve prolapse, but there was mild mitral regurgitation. Since I last saw her, the patient denies any dyspnea on exertion, orthopnea, PND, pedal edema, syncope or chest pain. Occasional brief flutters but no sustained palpitations.   Current Outpatient Prescriptions  Medication Sig Dispense Refill  . aspirin 81 MG EC tablet Take 81 mg by mouth daily.      . calcium carbonate (OS-CAL) 600 MG TABS Take 600 mg by mouth 2 (two) times daily.      . Omega-3 Fatty Acids (FISH OIL) 1200 MG CAPS Take by mouth daily.      . propranolol (INDERAL) 10 MG tablet TAKE ONE TABLET TWICE DAILY 60 tablet 4  . simvastatin (ZOCOR) 40 MG tablet Take 1 tablet (40 mg total) by mouth every evening. 30 tablet 0   No current facility-administered medications for this visit.     Past Medical History  Diagnosis Date  . Hyperlipidemia     mixed  . Mitral regurgitation   . SVT (supraventricular tachycardia)   . Mitral valve prolapse     History reviewed. No pertinent past surgical history.  Social History   Social History  . Marital Status: Married    Spouse Name: N/A  . Number of Children: N/A  . Years of Education: N/A   Occupational History  . Not on file.   Social History Main Topics  . Smoking status: Never Smoker   . Smokeless tobacco: Not on file  . Alcohol Use: Yes  . Drug Use: Not on file  . Sexual Activity: Not on file   Other Topics Concern  . Not on file   Social History Narrative    ROS: no fevers or chills, productive cough, hemoptysis, dysphasia, odynophagia, melena, hematochezia, dysuria, hematuria, rash, seizure activity, orthopnea, PND, pedal edema, claudication.  Remaining systems are negative.  Physical Exam: Well-developed well-nourished in no acute distress.  Skin is warm and dry.  HEENT is normal.  Neck is supple.  Chest is clear to auscultation with normal expansion.  Cardiovascular exam is regular rate and rhythm.  Abdominal exam nontender or distended. No masses palpated. Extremities show no edema. neuro grossly intact  ECG sinus rhythm at a rate of 61. Normal axis. No significant ST changes.

## 2015-02-27 ENCOUNTER — Encounter: Payer: Self-pay | Admitting: Cardiology

## 2015-02-27 ENCOUNTER — Ambulatory Visit (INDEPENDENT_AMBULATORY_CARE_PROVIDER_SITE_OTHER): Payer: Self-pay | Admitting: Cardiology

## 2015-02-27 VITALS — BP 132/78 | HR 60 | Ht 64.0 in | Wt 165.0 lb

## 2015-02-27 DIAGNOSIS — I471 Supraventricular tachycardia: Secondary | ICD-10-CM | POA: Diagnosis not present

## 2015-02-27 DIAGNOSIS — Z8679 Personal history of other diseases of the circulatory system: Secondary | ICD-10-CM

## 2015-02-27 NOTE — Assessment & Plan Note (Signed)
Continue statin.lipids and liver monitored by primary care. 

## 2015-02-27 NOTE — Patient Instructions (Signed)
Your physician wants you to follow-up in: ONE YEAR WITH DR CRENSHAW You will receive a reminder letter in the mail two months in advance. If you don't receive a letter, please call our office to schedule the follow-up appointment.  

## 2015-02-27 NOTE — Assessment & Plan Note (Signed)
Symptoms are reasonably well controlled. Continue beta blocker. 

## 2015-02-27 NOTE — Assessment & Plan Note (Signed)
Mitral regurgitation mild on most recent echocardiogram.

## 2015-03-05 ENCOUNTER — Encounter: Payer: Self-pay | Admitting: Cardiology

## 2015-03-25 ENCOUNTER — Other Ambulatory Visit: Payer: Self-pay | Admitting: Cardiology

## 2015-03-25 NOTE — Telephone Encounter (Signed)
Rx request sent to pharmacy.  

## 2015-06-24 ENCOUNTER — Other Ambulatory Visit: Payer: Self-pay | Admitting: Cardiology

## 2016-01-22 ENCOUNTER — Other Ambulatory Visit: Payer: Self-pay | Admitting: Cardiovascular Disease

## 2016-01-22 NOTE — Telephone Encounter (Signed)
Rx(s) sent to pharmacy electronically.6

## 2016-02-13 NOTE — Progress Notes (Signed)
      HPI: FU history of question of mitral valve prolapse, as well as supraventricular tachycardia that was diagnosed in the 1970s. She apparently had a nonischemic stress Myoview performed in 1999. She did have her last echocardiogram performed in July of 2012. Her LV function was normal. There was no mitral valve prolapse, but there was mild mitral regurgitation. Since I last saw her, the patient denies any dyspnea on exertion, orthopnea, PND, pedal edema, syncope or chest pain. Occasional brief flutter but no sustained palpitations.   Current Outpatient Prescriptions  Medication Sig Dispense Refill  . aspirin 81 MG EC tablet Take 81 mg by mouth daily.      . calcium carbonate (OS-CAL) 600 MG TABS Take 600 mg by mouth 2 (two) times daily.      . Omega-3 Fatty Acids (FISH OIL) 1200 MG CAPS Take by mouth daily.      . propranolol (INDERAL) 10 MG tablet TAKE ONE TABLET TWICE DAILY 60 tablet 8  . simvastatin (ZOCOR) 40 MG tablet Take 1 tablet (40 mg total) by mouth every evening. 30 tablet 6   No current facility-administered medications for this visit.      Past Medical History:  Diagnosis Date  . Hyperlipidemia    mixed  . Mitral regurgitation   . Mitral valve prolapse   . SVT (supraventricular tachycardia) (HCC)     History reviewed. No pertinent surgical history.  Social History   Social History  . Marital status: Married    Spouse name: N/A  . Number of children: N/A  . Years of education: N/A   Occupational History  . Not on file.   Social History Main Topics  . Smoking status: Never Smoker  . Smokeless tobacco: Never Used  . Alcohol use Yes  . Drug use: Unknown  . Sexual activity: Not on file   Other Topics Concern  . Not on file   Social History Narrative  . No narrative on file    Family History  Problem Relation Age of Onset  . Lupus Mother   . Osteoarthritis Mother   . Heart failure Mother   . Heart failure Father     ROS: no fevers or chills,  productive cough, hemoptysis, dysphasia, odynophagia, melena, hematochezia, dysuria, hematuria, rash, seizure activity, orthopnea, PND, pedal edema, claudication. Remaining systems are negative.  Physical Exam: Well-developed well-nourished in no acute distress.  Skin is warm and dry.  HEENT is normal.  Neck is supple.  Chest is clear to auscultation with normal expansion.  Cardiovascular exam is regular rate and rhythm.  Abdominal exam nontender or distended. No masses palpated. Extremities show no edema. neuro grossly intact  ECG-Sinus rhythm at a rate of 60. No ST changes.  A/P  1 Hyperlipidemia-continue statin.  2 supraventricular tachycardia-symptoms seem to be controlled at present. Continue beta blocker.  3 question history of mitral valve prolapse-not evident on last echocardiogram. Mitral regurgitation was mild. Plan repeat study.  Olga MillersBrian Seldon Barrell, MD

## 2016-02-19 ENCOUNTER — Ambulatory Visit (INDEPENDENT_AMBULATORY_CARE_PROVIDER_SITE_OTHER): Payer: Medicare Other | Admitting: Cardiology

## 2016-02-19 ENCOUNTER — Encounter: Payer: Self-pay | Admitting: Cardiology

## 2016-02-19 VITALS — BP 131/68 | HR 60 | Ht 64.0 in | Wt 167.1 lb

## 2016-02-19 DIAGNOSIS — I341 Nonrheumatic mitral (valve) prolapse: Secondary | ICD-10-CM

## 2016-02-19 DIAGNOSIS — E785 Hyperlipidemia, unspecified: Secondary | ICD-10-CM

## 2016-02-19 DIAGNOSIS — I471 Supraventricular tachycardia: Secondary | ICD-10-CM

## 2016-02-19 MED ORDER — SIMVASTATIN 40 MG PO TABS: ORAL_TABLET | ORAL | 3 refills | Status: DC

## 2016-02-19 MED ORDER — PROPRANOLOL HCL 10 MG PO TABS: 10.0000 mg | ORAL_TABLET | Freq: Two times a day (BID) | ORAL | 3 refills | Status: DC

## 2016-02-19 NOTE — Patient Instructions (Addendum)
Medication Instructions:   NO CHANGE= REFILLS SENT TO THE PHARMACY  Testing/Procedures:  Your physician has requested that you have an echocardiogram. Echocardiography is a painless test that uses sound waves to create images of your heart. It provides your doctor with information about the size and shape of your heart and how well your heart's chambers and valves are working. This procedure takes approximately one hour. There are no restrictions for this procedure.    Follow-Up:  Your physician wants you to follow-up in: ONE YEAR WITH DR Shelda PalRENSHAW You will receive a reminder letter in the mail two months in advance. If you don't receive a letter, please call our office to schedule the follow-up appointment.   If you need a refill on your cardiac medications before your next appointment, please call your pharmacy.

## 2016-02-26 ENCOUNTER — Ambulatory Visit (HOSPITAL_BASED_OUTPATIENT_CLINIC_OR_DEPARTMENT_OTHER)
Admission: RE | Admit: 2016-02-26 | Discharge: 2016-02-26 | Disposition: A | Discharge status: Home / Self Care | Payer: Medicare Other | Source: Ambulatory Visit | Attending: Cardiology | Admitting: Cardiology

## 2016-02-26 DIAGNOSIS — I341 Nonrheumatic mitral (valve) prolapse: Secondary | ICD-10-CM | POA: Insufficient documentation

## 2016-02-26 DIAGNOSIS — I509 Heart failure, unspecified: Secondary | ICD-10-CM | POA: Diagnosis not present

## 2016-02-26 NOTE — Progress Notes (Signed)
  Echocardiogram 2D Echocardiogram has been performed.  Nolon RodBrown, Tony 02/26/2016, 2:38 PM

## 2016-11-06 ENCOUNTER — Other Ambulatory Visit: Payer: Self-pay | Admitting: Cardiology

## 2017-02-19 ENCOUNTER — Encounter: Payer: Self-pay | Admitting: Cardiology

## 2017-02-19 NOTE — Progress Notes (Signed)
      HPI: FU history of question of mitral valve prolapse, as well as supraventricular tachycardia that was diagnosed in the 1970s. She apparently had a nonischemic stress Myoview performed in 1999. Echocardiogram repeated in September 2017 and showed normal LV systolic function, grade 2 diastolic dysfunction, no mitral valve prolapse with trace mitral regurgitation and mild left atrial enlargement. Since I last saw her, she denies dyspnea, chest pain or syncope. Occasional brief palpitations.  Current Outpatient Prescriptions  Medication Sig Dispense Refill  . aspirin 81 MG EC tablet Take 81 mg by mouth daily.      . Cholecalciferol (VITAMIN D3 PO) Take 2,000 Units by mouth daily.    . Omega-3 Fatty Acids (FISH OIL) 1200 MG CAPS Take by mouth daily.      . propranolol (INDERAL) 10 MG tablet Take 1 tablet (10 mg total) by mouth 2 (two) times daily. 180 tablet 0  . simvastatin (ZOCOR) 40 MG tablet Take 1 tablet (40 mg total) by mouth every evening. 90 tablet 0   No current facility-administered medications for this visit.      Past Medical History:  Diagnosis Date  . Hyperlipidemia    mixed  . Mitral regurgitation   . Mitral valve prolapse   . SVT (supraventricular tachycardia) (HCC)     History reviewed. No pertinent surgical history.  Social History   Social History  . Marital status: Married    Spouse name: N/A  . Number of children: N/A  . Years of education: N/A   Occupational History  . Not on file.   Social History Main Topics  . Smoking status: Never Smoker  . Smokeless tobacco: Never Used  . Alcohol use Yes  . Drug use: No  . Sexual activity: Not on file   Other Topics Concern  . Not on file   Social History Narrative  . No narrative on file    Family History  Problem Relation Age of Onset  . Lupus Mother   . Osteoarthritis Mother   . Heart failure Mother   . Heart failure Father     ROS: no fevers or chills, productive cough, hemoptysis,  dysphasia, odynophagia, melena, hematochezia, dysuria, hematuria, rash, seizure activity, orthopnea, PND, pedal edema, claudication. Remaining systems are negative.  Physical Exam: Well-developed well-nourished in no acute distress.  Skin is warm and dry.  HEENT is normal.  Neck is supple.  Chest is clear to auscultation with normal expansion.  Cardiovascular exam is regular rate and rhythm.  Abdominal exam nontender or distended. No masses palpated. Extremities show no edema. neuro grossly intact  ECG- sinus rhythm at a rate of 58. Nonspecific ST changes. personally reviewed  A/P  1 supraventricular tachycardia-patient has had no recent episodes. Continue beta blocker.  2 hyperlipidemia-continue statin. Lipids and liver monitored by primary care.  3 question history of mitral valve prolapse-not evident on most recent echocardiogram and trace mitral regurgitation.   4 palpitations-patient does have occasional flutters that can be worse at times. If these increase in frequency we will consider a monitor.  Olga Millers, MD

## 2017-03-03 ENCOUNTER — Encounter: Payer: Self-pay | Admitting: Cardiology

## 2017-03-03 ENCOUNTER — Ambulatory Visit (INDEPENDENT_AMBULATORY_CARE_PROVIDER_SITE_OTHER): Payer: Medicare Other | Admitting: Cardiology

## 2017-03-03 VITALS — BP 125/71 | HR 58 | Ht 64.0 in | Wt 165.4 lb

## 2017-03-03 DIAGNOSIS — R002 Palpitations: Secondary | ICD-10-CM

## 2017-03-03 DIAGNOSIS — I341 Nonrheumatic mitral (valve) prolapse: Secondary | ICD-10-CM

## 2017-03-03 DIAGNOSIS — I471 Supraventricular tachycardia: Secondary | ICD-10-CM

## 2017-03-03 MED ORDER — PROPRANOLOL HCL 10 MG PO TABS
10.0000 mg | ORAL_TABLET | Freq: Two times a day (BID) | ORAL | 3 refills | Status: DC
Start: 2017-03-03 — End: 2018-03-16

## 2017-03-03 MED ORDER — SIMVASTATIN 40 MG PO TABS: ORAL_TABLET | ORAL | 3 refills | Status: DC

## 2017-03-03 NOTE — Patient Instructions (Signed)
Your physician wants you to follow-up in: ONE YEAR WITH DR CRENSHAW You will receive a reminder letter in the mail two months in advance. If you don't receive a letter, please call our office to schedule the follow-up appointment.   If you need a refill on your cardiac medications before your next appointment, please call your pharmacy.  

## 2018-03-10 NOTE — Progress Notes (Signed)
HPI: FU history of question of mitral valve prolapse, as well as supraventricular tachycardia that was diagnosed in the 1970s. She apparently had a nonischemic stress Myoview performed in 1999. Echocardiogram repeated in September 2017 and showed normal LV systolic function, grade 2 diastolic dysfunction, no mitral valve prolapse with trace mitral regurgitation and mild left atrial enlargement. Since I last saw her, she has occasional brief palpitations not sustained.  She denies chest pain, dyspnea or syncope.  Current Outpatient Medications  Medication Sig Dispense Refill  . aspirin 81 MG EC tablet Take 81 mg by mouth daily.      . Cholecalciferol (VITAMIN D3 PO) Take 2,000 Units by mouth daily.    . Omega-3 Fatty Acids (FISH OIL) 1200 MG CAPS Take by mouth daily.      . propranolol (INDERAL) 10 MG tablet Take 1 tablet (10 mg total) by mouth 2 (two) times daily. 180 tablet 3  . simvastatin (ZOCOR) 40 MG tablet Take 1 tablet (40 mg total) by mouth every evening. 90 tablet 3   No current facility-administered medications for this visit.      Past Medical History:  Diagnosis Date  . Hyperlipidemia    mixed  . Mitral regurgitation   . Mitral valve prolapse   . SVT (supraventricular tachycardia) (HCC)     History reviewed. No pertinent surgical history.  Social History   Socioeconomic History  . Marital status: Married    Spouse name: Not on file  . Number of children: Not on file  . Years of education: Not on file  . Highest education level: Not on file  Occupational History  . Not on file  Social Needs  . Financial resource strain: Not on file  . Food insecurity:    Worry: Not on file    Inability: Not on file  . Transportation needs:    Medical: Not on file    Non-medical: Not on file  Tobacco Use  . Smoking status: Never Smoker  . Smokeless tobacco: Never Used  Substance and Sexual Activity  . Alcohol use: Yes  . Drug use: No  . Sexual activity: Not on file   Lifestyle  . Physical activity:    Days per week: Not on file    Minutes per session: Not on file  . Stress: Not on file  Relationships  . Social connections:    Talks on phone: Not on file    Gets together: Not on file    Attends religious service: Not on file    Active member of club or organization: Not on file    Attends meetings of clubs or organizations: Not on file    Relationship status: Not on file  . Intimate partner violence:    Fear of current or ex partner: Not on file    Emotionally abused: Not on file    Physically abused: Not on file    Forced sexual activity: Not on file  Other Topics Concern  . Not on file  Social History Narrative  . Not on file    Family History  Problem Relation Age of Onset  . Lupus Mother   . Osteoarthritis Mother   . Heart failure Mother   . Heart failure Father     ROS: no fevers or chills, productive cough, hemoptysis, dysphasia, odynophagia, melena, hematochezia, dysuria, hematuria, rash, seizure activity, orthopnea, PND, pedal edema, claudication. Remaining systems are negative.  Physical Exam: Well-developed well-nourished in no acute distress.  Skin  is warm and dry.  HEENT is normal.  Neck is supple.  Chest is clear to auscultation with normal expansion.  Cardiovascular exam is regular rate and rhythm.  Abdominal exam nontender or distended. No masses palpated. Extremities show no edema. neuro grossly intact  ECG-sinus bradycardia at a rate of 59.  Normal axis.  Nonspecific ST changes.  Personally reviewed  A/P  1 supraventricular tachycardia-patient continues to do well with no recurrent episodes.  Continue beta-blocker.  We could consider referral for ablation in the future if she has episodes despite medical therapy.  2 question mitral valve prolapse-not evident on most recent echocardiogram.  3 palpitations-occasional skips but symptoms are reasonably well controlled with present dose of beta-blocker.  4  hyperlipidemia-continue statin.  Laboratories monitored by primary care.  Olga Millers, MD

## 2018-03-16 ENCOUNTER — Encounter: Payer: Self-pay | Admitting: Cardiology

## 2018-03-16 ENCOUNTER — Ambulatory Visit: Payer: Medicare Other | Admitting: Cardiology

## 2018-03-16 VITALS — BP 131/68 | HR 59 | Ht 64.0 in | Wt 165.1 lb

## 2018-03-16 DIAGNOSIS — R002 Palpitations: Secondary | ICD-10-CM | POA: Diagnosis not present

## 2018-03-16 DIAGNOSIS — I471 Supraventricular tachycardia: Secondary | ICD-10-CM | POA: Diagnosis not present

## 2018-03-16 DIAGNOSIS — I341 Nonrheumatic mitral (valve) prolapse: Secondary | ICD-10-CM | POA: Diagnosis not present

## 2018-03-16 MED ORDER — SIMVASTATIN 40 MG PO TABS: ORAL_TABLET | ORAL | 3 refills | Status: DC

## 2018-03-16 MED ORDER — PROPRANOLOL HCL 10 MG PO TABS: 10.0000 mg | ORAL_TABLET | Freq: Two times a day (BID) | ORAL | 3 refills | Status: DC

## 2018-03-16 NOTE — Patient Instructions (Signed)
DR CRENSHAW WOULD LIKE TO SEE YOU BACK IN 1 YEAR.  PLEASE GIVE OUR OFFICE A CALL 2 MONTHS PRIOR TO THE APPOINTMENT TIME TO SCHEDULE

## 2019-03-30 ENCOUNTER — Other Ambulatory Visit: Payer: Self-pay | Admitting: Cardiology

## 2019-03-30 MED ORDER — PROPRANOLOL HCL 10 MG PO TABS: 10.0000 mg | ORAL_TABLET | Freq: Two times a day (BID) | ORAL | 0 refills | Status: DC

## 2019-03-30 MED ORDER — SIMVASTATIN 40 MG PO TABS: ORAL_TABLET | ORAL | 0 refills | Status: DC

## 2019-03-30 NOTE — Telephone Encounter (Signed)
° °*  STAT* If patient is at the pharmacy, call can be transferred to refill team.   1. Which medications need to be refilled? (please list name of each medication and dose if known)  simvastatin (ZOCOR) 40 MG tablet propranolol (INDERAL) 10 MG tablet  2. Which pharmacy/location (including street and city if local pharmacy) is medication to be sent to? [place], [place] AT [place]  3. Do they need a 30 day or 90 day supply? 90  Pt is out of refills. She will see Dr. Stanford Breed on 11/18

## 2019-03-30 NOTE — Telephone Encounter (Signed)
Requested Prescriptions   Signed Prescriptions Disp Refills  . simvastatin (ZOCOR) 40 MG tablet 90 tablet 0    Sig: Take 1 tablet (40 mg total) by mouth every evening.    Authorizing Provider: Lelon Perla    Ordering User: NEWCOMER MCCLAIN, Namira Rosekrans L  . propranolol (INDERAL) 10 MG tablet 180 tablet 0    Sig: Take 1 tablet (10 mg total) by mouth 2 (two) times daily.    Authorizing Provider: Lelon Perla    Ordering User: Raelene Bott, Vessie Olmsted L

## 2019-04-18 NOTE — Progress Notes (Signed)
HPI: FU history of question of mitral valve prolapse, as well as supraventricular tachycardia that was diagnosed in the 1970s. She apparently had a nonischemic stress Myoview performed in 1999.Echocardiogram repeated in September 2017 and showed normal LV systolic function, grade 2 diastolic dysfunction, no mitral valve prolapse with trace mitral regurgitation and mild left atrial enlargement.Since I last saw her,   Current Outpatient Medications  Medication Sig Dispense Refill  . aspirin 81 MG EC tablet Take 81 mg by mouth daily.      . Cholecalciferol (VITAMIN D3 PO) Take 2,000 Units by mouth daily.    . Multiple Vitamins-Minerals (ZINC PO) Take 1 tablet by mouth daily.    . Omega-3 Fatty Acids (FISH OIL) 1200 MG CAPS Take by mouth daily.      . propranolol (INDERAL) 10 MG tablet Take 1 tablet (10 mg total) by mouth 2 (two) times daily. 180 tablet 0  . simvastatin (ZOCOR) 40 MG tablet Take 1 tablet (40 mg total) by mouth every evening. 90 tablet 0  . vitamin C (ASCORBIC ACID) 500 MG tablet Take 500 mg by mouth daily.     No current facility-administered medications for this visit.      Past Medical History:  Diagnosis Date  . Hyperlipidemia    mixed  . Mitral regurgitation   . Mitral valve prolapse   . SVT (supraventricular tachycardia) (HCC)     History reviewed. No pertinent surgical history.  Social History   Socioeconomic History  . Marital status: Married    Spouse name: Not on file  . Number of children: Not on file  . Years of education: Not on file  . Highest education level: Not on file  Occupational History  . Not on file  Social Needs  . Financial resource strain: Not on file  . Food insecurity    Worry: Not on file    Inability: Not on file  . Transportation needs    Medical: Not on file    Non-medical: Not on file  Tobacco Use  . Smoking status: Never Smoker  . Smokeless tobacco: Never Used  Substance and Sexual Activity  . Alcohol use: Yes   . Drug use: No  . Sexual activity: Not on file  Lifestyle  . Physical activity    Days per week: Not on file    Minutes per session: Not on file  . Stress: Not on file  Relationships  . Social Musician on phone: Not on file    Gets together: Not on file    Attends religious service: Not on file    Active member of club or organization: Not on file    Attends meetings of clubs or organizations: Not on file    Relationship status: Not on file  . Intimate partner violence    Fear of current or ex partner: Not on file    Emotionally abused: Not on file    Physically abused: Not on file    Forced sexual activity: Not on file  Other Topics Concern  . Not on file  Social History Narrative  . Not on file    Family History  Problem Relation Age of Onset  . Lupus Mother   . Osteoarthritis Mother   . Heart failure Mother   . Heart failure Father     ROS: no fevers or chills, productive cough, hemoptysis, dysphasia, odynophagia, melena, hematochezia, dysuria, hematuria, rash, seizure activity, orthopnea, PND, pedal edema, claudication.  Remaining systems are negative.  Physical Exam: Well-developed well-nourished in no acute distress.  Skin is warm and dry.  HEENT is normal.  Neck is supple.  Chest is clear to auscultation with normal expansion.  Cardiovascular exam is regular rate and rhythm.  Abdominal exam nontender or distended. No masses palpated. Extremities show no edema. neuro grossly intact  ECG-sinus bradycardia at a rate of 57, nonspecific ST changes.  Personally reviewed  A/P  1 SVT-no recurrences; continue beta blocker; can refer to EP in the future if she has recurrences.  2 Palpitations-continue beta blocker.  3 H/O MVP-not evident on most recent echo.  4 Hyperlipidemia-continue statin.  Kirk Ruths, MD

## 2019-04-26 ENCOUNTER — Other Ambulatory Visit: Payer: Self-pay

## 2019-04-26 ENCOUNTER — Encounter: Payer: Self-pay | Admitting: Cardiology

## 2019-04-26 ENCOUNTER — Ambulatory Visit: Payer: Medicare Other | Admitting: Cardiology

## 2019-04-26 VITALS — BP 137/76 | HR 57 | Ht 64.0 in | Wt 161.1 lb

## 2019-04-26 DIAGNOSIS — I341 Nonrheumatic mitral (valve) prolapse: Secondary | ICD-10-CM | POA: Diagnosis not present

## 2019-04-26 DIAGNOSIS — I471 Supraventricular tachycardia: Secondary | ICD-10-CM | POA: Diagnosis not present

## 2019-04-26 DIAGNOSIS — R002 Palpitations: Secondary | ICD-10-CM | POA: Diagnosis not present

## 2019-04-26 MED ORDER — PROPRANOLOL HCL 10 MG PO TABS: 10.0000 mg | ORAL_TABLET | Freq: Two times a day (BID) | ORAL | 3 refills | Status: DC

## 2019-04-26 MED ORDER — SIMVASTATIN 40 MG PO TABS: ORAL_TABLET | ORAL | 3 refills | Status: DC

## 2019-04-26 NOTE — Patient Instructions (Signed)
Medication Instructions:  Refill sent to the pharmacy electronically.  *If you need a refill on your cardiac medications before your next appointment, please call your pharmacy*  Lab Work: If you have labs (blood work) drawn today and your tests are completely normal, you will receive your results only by: . MyChart Message (if you have MyChart) OR . A paper copy in the mail If you have any lab test that is abnormal or we need to change your treatment, we will call you to review the results.  Follow-Up: At [facility], you and your health needs are our priority.  As part of our continuing mission to provide you with exceptional heart care, we have created designated Provider Care Teams.  These Care Teams include your primary Cardiologist (physician) and Advanced Practice Providers (APPs -  Physician Assistants and Nurse Practitioners) who all work together to provide you with the care you need, when you need it.  Your next appointment:   12 months  The format for your next appointment:   In Person  Provider:   Brian Crenshaw, MD  

## 2020-05-15 NOTE — Progress Notes (Signed)
HPI: FU history of question of mitral valve prolapse, as well as supraventricular tachycardia that was diagnosed in the 1970s. She apparently had a nonischemic stress Myoview performed in 1999.Echocardiogram repeated in September 2017 and showed normal LV systolic function, grade 2 diastolic dysfunction, no mitral valve prolapse with trace mitral regurgitation and mild left atrial enlargement.Since I last saw her,the patient has dyspnea with more extreme activities but not with routine activities. It is relieved with rest. It is not associated with chest pain. There is no orthopnea, PND or pedal edema. There is no syncope or palpitations. There is no exertional chest pain.   Current Outpatient Medications  Medication Sig Dispense Refill  . aspirin 81 MG EC tablet Take 81 mg by mouth daily.    . Cholecalciferol (VITAMIN D3 PO) Take 2,000 Units by mouth daily.    . fluticasone (FLONASE) 50 MCG/ACT nasal spray Place 2 sprays into both nostrils as needed.    . Omega-3 Fatty Acids (FISH OIL) 1200 MG CAPS Take by mouth daily.    . propranolol (INDERAL) 10 MG tablet Take 1 tablet (10 mg total) by mouth 2 (two) times daily. 180 tablet 3  . simvastatin (ZOCOR) 40 MG tablet Take 1 tablet (40 mg total) by mouth every evening. 90 tablet 3  . vitamin C (ASCORBIC ACID) 500 MG tablet Take 500 mg by mouth daily.    . Zinc 50 MG TABS Take 1 tablet by mouth daily in the afternoon.     No current facility-administered medications for this visit.     Past Medical History:  Diagnosis Date  . Hyperlipidemia    mixed  . Mitral regurgitation   . Mitral valve prolapse   . SVT (supraventricular tachycardia) (HCC)     History reviewed. No pertinent surgical history.  Social History   Socioeconomic History  . Marital status: Married    Spouse name: Not on file  . Number of children: Not on file  . Years of education: Not on file  . Highest education level: Not on file  Occupational History  .  Not on file  Tobacco Use  . Smoking status: Never Smoker  . Smokeless tobacco: Never Used  Vaping Use  . Vaping Use: Never used  Substance and Sexual Activity  . Alcohol use: Yes  . Drug use: No  . Sexual activity: Not on file  Other Topics Concern  . Not on file  Social History Narrative  . Not on file   Social Determinants of Health   Financial Resource Strain: Not on file  Food Insecurity: Not on file  Transportation Needs: Not on file  Physical Activity: Not on file  Stress: Not on file  Social Connections: Not on file  Intimate Partner Violence: Not on file    Family History  Problem Relation Age of Onset  . Lupus Mother   . Osteoarthritis Mother   . Heart failure Mother   . Heart failure Father     ROS: no fevers or chills, productive cough, hemoptysis, dysphasia, odynophagia, melena, hematochezia, dysuria, hematuria, rash, seizure activity, orthopnea, PND, pedal edema, claudication. Remaining systems are negative.  Physical Exam: Well-developed well-nourished in no acute distress.  Skin is warm and dry.  HEENT is normal.  Neck is supple.  Chest is clear to auscultation with normal expansion.  Cardiovascular exam is regular rate and rhythm.  Abdominal exam nontender or distended. No masses palpated. Extremities show no edema. neuro grossly intact  ECG-sinus bradycardia at  a rate of 58, nonspecific ST changes.  Personally reviewed  A/P  1 supraventricular tachycardia-patient has had no recurrences.  We will continue beta-blocker at present dose.  Will consider referral to electrophysiology in the future for consideration of ablation if she has more frequent episodes.  2 history of palpitations-continue beta-blocker.  3 hyperlipidemia-continue statin.  4 history of mitral valve prolapse-not evident on most recent echocardiogram.  Olga Millers, MD

## 2020-05-22 ENCOUNTER — Ambulatory Visit: Payer: Medicare PPO | Admitting: Cardiology

## 2020-05-22 ENCOUNTER — Other Ambulatory Visit: Payer: Self-pay

## 2020-05-22 ENCOUNTER — Encounter: Payer: Self-pay | Admitting: Cardiology

## 2020-05-22 VITALS — BP 133/77 | HR 58 | Ht 64.0 in | Wt 164.0 lb

## 2020-05-22 DIAGNOSIS — I341 Nonrheumatic mitral (valve) prolapse: Secondary | ICD-10-CM

## 2020-05-22 DIAGNOSIS — I471 Supraventricular tachycardia: Secondary | ICD-10-CM

## 2020-05-22 DIAGNOSIS — R002 Palpitations: Secondary | ICD-10-CM

## 2020-05-22 MED ORDER — SIMVASTATIN 40 MG PO TABS: ORAL_TABLET | ORAL | 3 refills | Status: DC

## 2020-05-22 MED ORDER — PROPRANOLOL HCL 10 MG PO TABS: 10.0000 mg | ORAL_TABLET | Freq: Two times a day (BID) | ORAL | 3 refills | Status: DC

## 2020-05-22 NOTE — Patient Instructions (Signed)

## 2020-06-12 ENCOUNTER — Other Ambulatory Visit: Payer: Self-pay | Admitting: Cardiology

## 2021-06-04 NOTE — Progress Notes (Signed)
HPI:FU history of question of mitral valve prolapse, as well as supraventricular tachycardia that was diagnosed in the 1970s. She apparently had a nonischemic stress Myoview performed in 1999. Echocardiogram repeated in September 2017 and showed normal LV systolic function, grade 2 diastolic dysfunction, no mitral valve prolapse with trace mitral regurgitation and mild left atrial enlargement. Since I last saw her, she does note occasional palpitations slightly worse compared to previous.  She denies dyspnea, chest pain or syncope.  Current Outpatient Medications  Medication Sig Dispense Refill   alendronate (FOSAMAX) 70 MG tablet Take 70 mg by mouth once a week.     aspirin 81 MG EC tablet Take 81 mg by mouth daily.     Cholecalciferol (VITAMIN D3 PO) Take 2,000 Units by mouth daily.     fluticasone (FLONASE) 50 MCG/ACT nasal spray Place 2 sprays into both nostrils as needed.     Omega-3 Fatty Acids (FISH OIL) 1200 MG CAPS Take by mouth daily.     propranolol (INDERAL) 10 MG tablet TAKE ONE TABLET BY MOUTH TWICE DAILY 180 tablet 2   simvastatin (ZOCOR) 40 MG tablet TAKE ONE TABLET BY MOUTH EVERY EVENING 90 tablet 2   vitamin C (ASCORBIC ACID) 500 MG tablet Take 500 mg by mouth daily.     Zinc 50 MG TABS Take 1 tablet by mouth daily in the afternoon.     No current facility-administered medications for this visit.     Past Medical History:  Diagnosis Date   Hyperlipidemia    mixed   Mitral regurgitation    Mitral valve prolapse    SVT (supraventricular tachycardia) ([place])     History reviewed. No pertinent surgical history.  Social History   Socioeconomic History   Marital status: Married    Spouse name: Not on file   Number of children: Not on file   Years of education: Not on file   Highest education level: Not on file  Occupational History   Not on file  Tobacco Use   Smoking status: Never   Smokeless tobacco: Never  Vaping Use   Vaping Use: Never used   Substance and Sexual Activity   Alcohol use: Yes   Drug use: No   Sexual activity: Not on file  Other Topics Concern   Not on file  Social History Narrative   Not on file   Social Determinants of Health   Financial Resource Strain: Not on file  Food Insecurity: Not on file  Transportation Needs: Not on file  Physical Activity: Not on file  Stress: Not on file  Social Connections: Not on file  Intimate Partner Violence: Not on file    Family History  Problem Relation Age of Onset   Lupus Mother    Osteoarthritis Mother    Heart failure Mother    Heart failure Father     ROS: no fevers or chills, productive cough, hemoptysis, dysphasia, odynophagia, melena, hematochezia, dysuria, hematuria, rash, seizure activity, orthopnea, PND, pedal edema, claudication. Remaining systems are negative.  Physical Exam: Well-developed well-nourished in no acute distress.  Skin is warm and dry.  HEENT is normal.  Neck is supple.  Chest is clear to auscultation with normal expansion.  Cardiovascular exam is regular rate and rhythm.  Abdominal exam nontender or distended. No masses palpated. Extremities show no edema. neuro grossly intact  ECG-normal sinus rhythm at a rate of 76, no significant ST changes.  Personally reviewed  A/P  1 SVT-no recurrences since  last office visit.  We will continue beta-blockade.  2 history of palpitations-palpitations are worse compared to previous.  Increase Inderal to 20 mg twice daily.  3 hyperlipidemia-continue statin.  Patient was recently diagnosed with diabetes mellitus.  Also with family history.  We will plan calcium score and if elevated change to higher intensity statin.  4 mitral valve prolapse-questionable history of this but not evident on most recent echocardiogram.  Olga Millers, MD

## 2021-06-08 ENCOUNTER — Other Ambulatory Visit: Payer: Self-pay | Admitting: Cardiology

## 2021-06-16 ENCOUNTER — Other Ambulatory Visit: Payer: Self-pay

## 2021-06-16 ENCOUNTER — Ambulatory Visit: Payer: Medicare PPO | Admitting: Cardiology

## 2021-06-16 ENCOUNTER — Encounter: Payer: Self-pay | Admitting: Cardiology

## 2021-06-16 VITALS — BP 132/68 | HR 76 | Ht 64.0 in | Wt 163.1 lb

## 2021-06-16 DIAGNOSIS — Z136 Encounter for screening for cardiovascular disorders: Secondary | ICD-10-CM

## 2021-06-16 DIAGNOSIS — I471 Supraventricular tachycardia: Secondary | ICD-10-CM | POA: Diagnosis not present

## 2021-06-16 DIAGNOSIS — R002 Palpitations: Secondary | ICD-10-CM | POA: Diagnosis not present

## 2021-06-16 MED ORDER — PROPRANOLOL HCL 20 MG PO TABS: 10.0000 mg | ORAL_TABLET | Freq: Two times a day (BID) | ORAL | 3 refills | Status: DC

## 2021-06-16 MED ORDER — SIMVASTATIN 40 MG PO TABS: ORAL_TABLET | ORAL | 3 refills | Status: DC

## 2021-06-16 NOTE — Patient Instructions (Signed)
Medication Instructions:   INCREASE PROPRANOLOL TO 20 MG TWICE DAILY= 2 OF THE 20 MG TABLETS TWICE DAILY  *If you need a refill on your cardiac medications before your next appointment, please call your pharmacy*   Testing/Procedures:  CORONARY CALCIUM SCORING CT SCAN AT THE [place] OFFICE   Follow-Up: At [facility], you and your health needs are our priority.  As part of our continuing mission to provide you with exceptional heart care, we have created designated Provider Care Teams.  These Care Teams include your primary Cardiologist (physician) and Advanced Practice Providers (APPs -  Physician Assistants and Nurse Practitioners) who all work together to provide you with the care you need, when you need it.  We recommend signing up for the patient portal called "MyChart".  Sign up information is provided on this After Visit Summary.  MyChart is used to connect with patients for Virtual Visits (Telemedicine).  Patients are able to view lab/test results, encounter notes, upcoming appointments, etc.  Non-urgent messages can be sent to your provider as well.   To learn more about what you can do with MyChart, go to [email or web address].    Your next appointment:   12 month(s)  The format for your next appointment:   In Person  Provider:   Kirk Ruths MD

## 2021-07-09 ENCOUNTER — Other Ambulatory Visit: Payer: Self-pay

## 2021-07-09 ENCOUNTER — Ambulatory Visit (INDEPENDENT_AMBULATORY_CARE_PROVIDER_SITE_OTHER): Payer: Medicare PPO

## 2021-07-09 DIAGNOSIS — Z136 Encounter for screening for cardiovascular disorders: Secondary | ICD-10-CM

## 2021-07-21 ENCOUNTER — Encounter: Payer: Self-pay | Admitting: Cardiology

## 2021-07-21 ENCOUNTER — Other Ambulatory Visit: Payer: Self-pay

## 2021-07-21 MED ORDER — PROPRANOLOL HCL 20 MG PO TABS: 20.0000 mg | ORAL_TABLET | Freq: Two times a day (BID) | ORAL | 3 refills | Status: DC

## 2021-07-21 NOTE — Telephone Encounter (Signed)
Refill sent to pharmacy.   

## 2022-06-14 ENCOUNTER — Other Ambulatory Visit: Payer: Self-pay | Admitting: Cardiology

## 2022-06-22 ENCOUNTER — Ambulatory Visit: Payer: Medicare PPO | Admitting: Cardiology

## 2022-07-08 ENCOUNTER — Encounter: Payer: Self-pay | Admitting: Nurse Practitioner

## 2022-07-08 ENCOUNTER — Ambulatory Visit: Payer: Medicare PPO | Attending: Cardiology | Admitting: Nurse Practitioner

## 2022-07-08 VITALS — BP 140/68 | HR 57 | Ht 64.0 in | Wt 151.4 lb

## 2022-07-08 DIAGNOSIS — I080 Rheumatic disorders of both mitral and aortic valves: Secondary | ICD-10-CM | POA: Diagnosis not present

## 2022-07-08 DIAGNOSIS — I471 Supraventricular tachycardia, unspecified: Secondary | ICD-10-CM

## 2022-07-08 DIAGNOSIS — E119 Type 2 diabetes mellitus without complications: Secondary | ICD-10-CM

## 2022-07-08 DIAGNOSIS — R002 Palpitations: Secondary | ICD-10-CM

## 2022-07-08 DIAGNOSIS — E782 Mixed hyperlipidemia: Secondary | ICD-10-CM

## 2022-07-08 MED ORDER — PROPRANOLOL HCL 20 MG PO TABS: 20.0000 mg | ORAL_TABLET | Freq: Two times a day (BID) | ORAL | 3 refills | Status: DC

## 2022-07-08 NOTE — Progress Notes (Signed)
Office Visit    Patient Name: ORLA ESTRIN Date of Encounter: 07/08/2022  Primary Care Provider:  Jamesetta Geralds, MD Primary Cardiologist:  Kirk Ruths, MD  Chief Complaint    72 year old female with a history of palpitations, SVT, mitral valve prolapse, hyperlipidemia, and type 2 diabetes who presents for follow-up related to palpitations.  Past Medical History    Past Medical History:  Diagnosis Date   Hyperlipidemia    mixed   Mitral regurgitation    Mitral valve prolapse    SVT (supraventricular tachycardia)    No past surgical history on file.  Allergies  No Known Allergies   Labs/Other Studies Reviewed    The following studies were reviewed today: Echo 02/2016: Study Conclusions   - Left ventricle: The cavity size was normal. Wall thickness was    normal. Systolic function was normal. The estimated ejection    fraction was in the range of 60% to 65%. Wall motion was normal;    there were no regional wall motion abnormalities. Features are    consistent with a pseudonormal left ventricular filling pattern,    with concomitant abnormal relaxation and increased filling    pressure (grade 2 diastolic dysfunction).  - Aortic valve: Poorly visualized. There was no stenosis.  - Mitral valve: No mitral valve prolapse noted. There was trivial    regurgitation.  - Left atrium: The atrium was mildly dilated.  - Right ventricle: The cavity size was normal. Systolic function    was normal.  - Pulmonary arteries: PA peak pressure: 28 mm Hg (S).  - Inferior vena cava: The vessel was normal in size. The    respirophasic diameter changes were in the normal range (>= 50%),    consistent with normal central venous pressure.   Impressions:   - Normal LV size with EF 60-65%. Moderate diastolic dysfunction.    Normal RV size and systolic function. No significant valvular    abnormalities.   Coronary calcium scoring 07/09/2021: EXAM: Coronary Calcium Score    TECHNIQUE: A gated, non-contrast computed tomography scan of the heart was performed using 1mm slice thickness. Axial images were analyzed on a dedicated workstation. Calcium scoring of the coronary arteries was performed using the Agatston method.   FINDINGS: Coronary arteries: Normal origins.   Coronary Calcium Score:   Left main: 0   Left anterior descending artery: 0   Left circumflex artery: 0   Right coronary artery: 0   Total: 0   Pericardium: Normal.   Ascending Aorta: Normal caliber.   Non-cardiac: See separate report from [facility].   IMPRESSION: Coronary calcium score of 0 Agatston units. This suggests low risk for future cardiac events. Recent Labs: No results found for requested labs within last 365 days.  Recent Lipid Panel    Component Value Date/Time   CHOL 181 12/20/2012 1028   TRIG 85.0 12/20/2012 1028   HDL 85.70 12/20/2012 1028   CHOLHDL 2 12/20/2012 1028   VLDL 17.0 12/20/2012 1028   LDLCALC 78 12/20/2012 1028    History of Present Illness    72 year old female with the above past medical history including palpitations, SVT, mitral valve prolapse, hyperlipidemia, and type 2 diabetes.   She has a questionable history of mitral valve prolapse as well as supraventricular tachycardia that was diagnosed in the 1970s.  She reportedly had a nonischemic stress Myoview in 1999.  Echocardiogram in September 2017 showed normal LV systolic function, G2 DD, no mitral valve prolapse with trace mitral valve  regurgitation and mild left atrial enlargement.  She was last seen in the office on 06/16/2021 and was stable from a cardiac standpoint.  She did note occasional palpitations.  Her Inderal was increased to 20 mg twice daily.  CT calcium score in 07/2021 negative hyperlipidemia and diabetes showed coronary calcium score of 0.  She presents today for follow-up.  Since her last visit she has been stable from a cardiac standpoint.  She had a recent back  injury which caused significant pain, she has improved significantly and is about to begin physical therapy.  She denies any palpitations, dizziness, chest pain, dyspnea, edema, PND, orthopnea, weight gain.  Overall, she reports feeling well.  Home Medications    Current Outpatient Medications  Medication Sig Dispense Refill   alendronate (FOSAMAX) 70 MG tablet Take 70 mg by mouth once a week.     aspirin 81 MG EC tablet Take 81 mg by mouth daily.     Cholecalciferol (VITAMIN D3 PO) Take 2,000 Units by mouth daily.     fluticasone (FLONASE) 50 MCG/ACT nasal spray Place 2 sprays into both nostrils as needed.     Omega-3 Fatty Acids (FISH OIL) 1200 MG CAPS Take by mouth daily.     simvastatin (ZOCOR) 40 MG tablet TAKE ONE TABLET BY MOUTH EVERY EVENING 90 tablet 0   vitamin C (ASCORBIC ACID) 500 MG tablet Take 500 mg by mouth daily.     Zinc 50 MG TABS Take 1 tablet by mouth daily in the afternoon.     propranolol (INDERAL) 20 MG tablet Take 1 tablet (20 mg total) by mouth 2 (two) times daily. 180 tablet 3   No current facility-administered medications for this visit.     Review of Systems    She denies chest pain, palpitations, dyspnea, pnd, orthopnea, n, v, dizziness, syncope, edema, weight gain, or early satiety. All other systems reviewed and are otherwise negative except as noted above.   Physical Exam    VS:  BP (!) 140/68   Pulse (!) 57   Ht 5\' 4"  (1.626 m)   Wt 151 lb 6.4 oz (68.7 kg)   LMP  (LMP Unknown)   SpO2 96%   BMI 25.99 kg/m  GEN: Well nourished, well developed, in no acute distress. HEENT: normal. Neck: Supple, no JVD, carotid bruits, or masses. Cardiac: RRR, no murmurs, rubs, or gallops. No clubbing, cyanosis, edema.  Radials/DP/PT 2+ and equal bilaterally.  Respiratory:  Respirations regular and unlabored, clear to auscultation bilaterally. GI: Soft, nontender, nondistended, BS + x 4. MS: no deformity or atrophy. Skin: warm and dry, no rash. Neuro:  Strength  and sensation are intact. Psych: Normal affect.  Accessory Clinical Findings    ECG personally reviewed by me today -sinus bardycardia, 57 bpm  - no acute changes.   No results found for: "WBC", "HGB", "HCT", "MCV", "PLT" No results found for: "CREATININE", "BUN", "NA", "K", "CL", "CO2" Lab Results  Component Value Date   ALT 20 12/20/2012   AST 23 12/20/2012   ALKPHOS 58 12/20/2012   BILITOT 0.8 12/20/2012   Lab Results  Component Value Date   CHOL 181 12/20/2012   HDL 85.70 12/20/2012   LDLCALC 78 12/20/2012   TRIG 85.0 12/20/2012   CHOLHDL 2 12/20/2012    No results found for: "HGBA1C"  Assessment & Plan   1. Palpitations/SVT: Denies any recent palpitations.  Continue propranolol.  2.  Elevated BP reading: BP mildly elevated in office today, generally well-controlled.  Continue  to monitor and report BP consistently greater than 140/80.  3. Hyperlipidemia: LDL was 59 in 05/2022.  Coronary calcium score was 0.  Continue aspirin, simvastatin.  4. Mitral valve prolapse/regurgitation: Questionable history of mitral valve prolapse.  Mild mitral valve regurgitation noted on echo in 2017, no evidence of mitral valve prolapse at that time.  Asymptomatic.  She declines repeat echo at this time but will consider at next follow-up visit.  5. Type 2 diabetes: A1c was 6.4 in 05/2022.  Monitored and managed per PCP.  6. Disposition: Follow-up in 1 year with Dr. Stanford Breed.  HYPERTENSION CONTROL Vitals:   07/08/22 1332 07/08/22 1353  BP: (!) 146/70 (!) 140/68    The patient's blood pressure is elevated above target today.  In order to address the patient's elevated BP: Blood pressure will be monitored at home to determine if medication changes need to be made.      Lenna Sciara, NP 07/08/2022, 3:11 PM

## 2022-07-08 NOTE — Patient Instructions (Signed)
Medication Instructions:   Your physician recommends that you continue on your current medications as directed. Please refer to the Current Medication list given to you today.  *If you need a refill on your cardiac medications before your next appointment, please call your pharmacy*  Lab Work: NONE ordered at this time of appointment   If you have labs (blood work) drawn today and your tests are completely normal, you will receive your results only by: MyChart Message (if you have MyChart) OR A paper copy in the mail If you have any lab test that is abnormal or we need to change your treatment, we will call you to review the results.  Testing/Procedures: NONE ordered at this time of appointment   Follow-Up: At [facility], you and your health needs are our priority.  As part of our continuing mission to provide you with exceptional heart care, we have created designated Provider Care Teams.  These Care Teams include your primary Cardiologist (physician) and Advanced Practice Providers (APPs -  Physician Assistants and Nurse Practitioners) who all work together to provide you with the care you need, when you need it.  We recommend signing up for the patient portal called "MyChart".  Sign up information is provided on this After Visit Summary.  MyChart is used to connect with patients for Virtual Visits (Telemedicine).  Patients are able to view lab/test results, encounter notes, upcoming appointments, etc.  Non-urgent messages can be sent to your provider as well.   To learn more about what you can do with MyChart, go to [email or web address].    Your next appointment:   1 year(s)  Provider:   Brian Crenshaw, MD     Other Instructions   

## 2022-09-22 ENCOUNTER — Other Ambulatory Visit: Payer: Self-pay | Admitting: Cardiology

## 2022-12-23 ENCOUNTER — Other Ambulatory Visit: Payer: Self-pay | Admitting: Cardiology

## 2023-05-08 IMAGING — CT CT CARDIAC CORONARY ARTERY CALCIUM SCORE
3 series · 14 of 20 positions shown, 16 images · non-contrast
Comparison: None.
COMPARISON: None.

Addendum:
EXAM:
OVER-READ INTERPRETATION  CT CHEST

The following report is an over-read performed by radiologist Dr.
over-read does not include interpretation of cardiac or coronary
anatomy or pathology. The coronary calcium score interpretation by
the cardiologist is attached.
CLINICAL DATA: Cardiovascular Disease Risk stratification
Coronary Calcium Score
TECHNIQUE: A gated, non-contrast computed tomography scan of the heart was
performed using 3mm slice thickness. Axial images were analyzed on a
dedicated workstation. Calcium scoring of the coronary arteries was
performed using the Agatston method.

[Series 2: cascseq 3.0 sa36 70% (id) · axial · 0.33mm/px · z∈[-211,-130]mm · 4 of 45 slices shown]
[im 9/45  vessel]
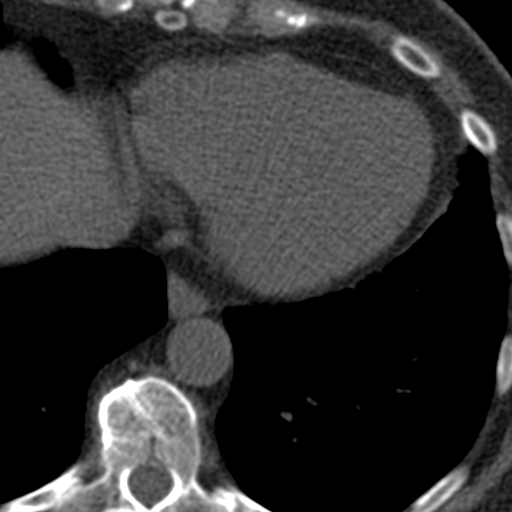
[im 18/45  vessel]
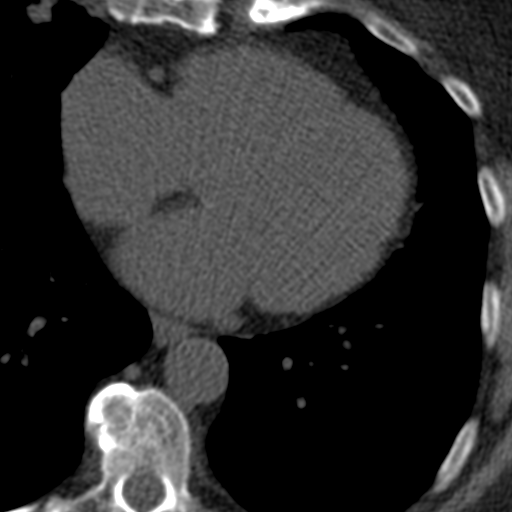
[im 27/45  vessel]
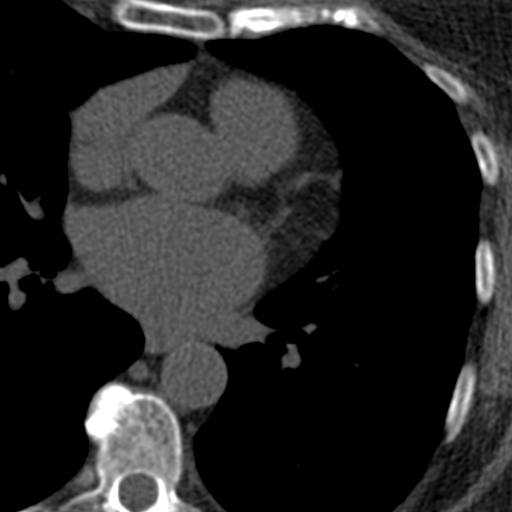
[im 36/45  vessel]
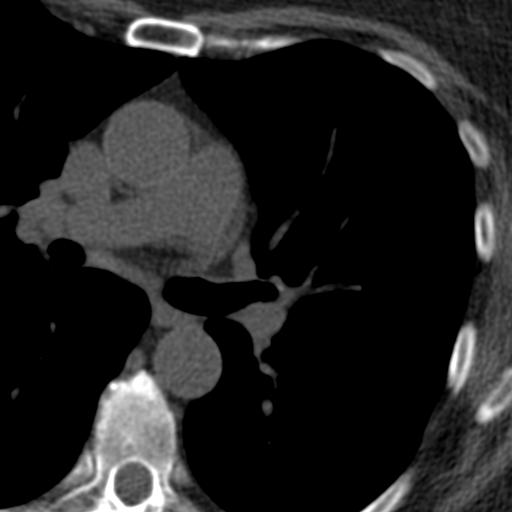

[Series 3: full fov st (id) · axial · 0.52mm/px · z∈[-214,-127]mm · 5 of 45 slices shown, 7 images]
[im 8/45  vessel]
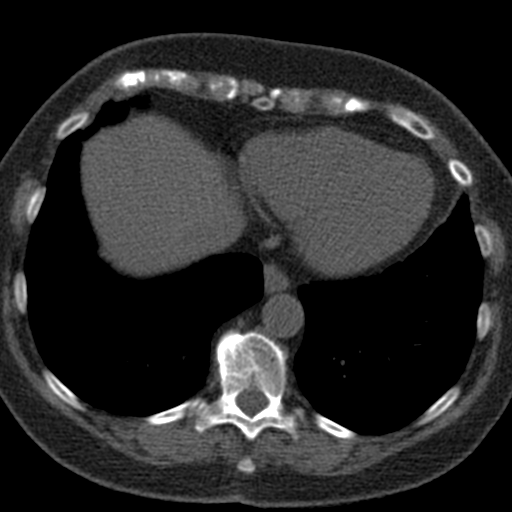
[im 8/45  lung]
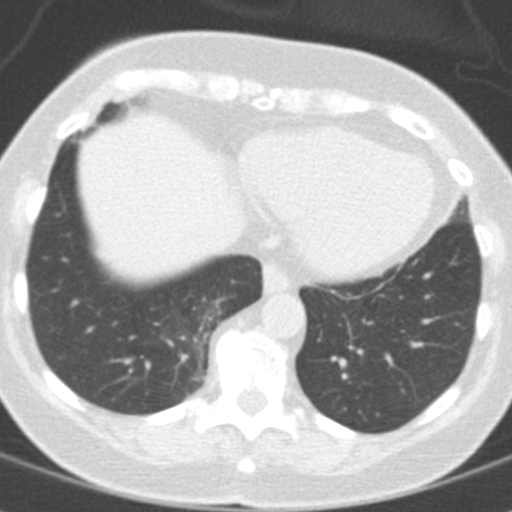
[im 15/45  vessel]
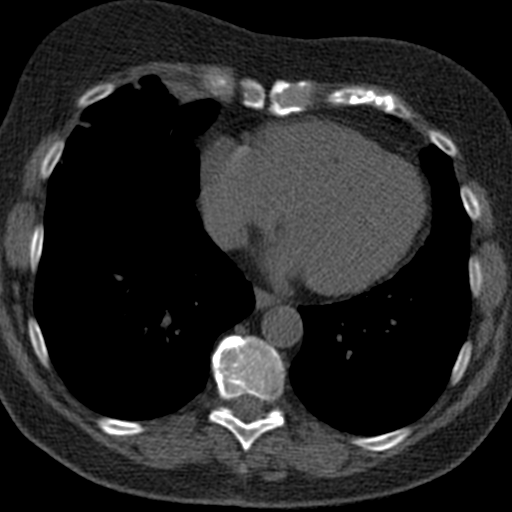
[im 23/45  vessel]
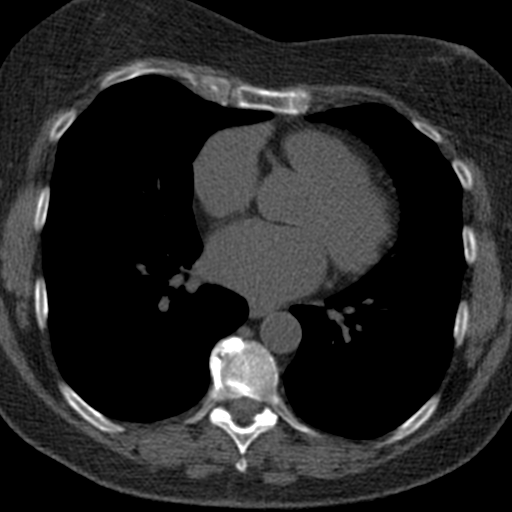
[im 30/45  vessel]
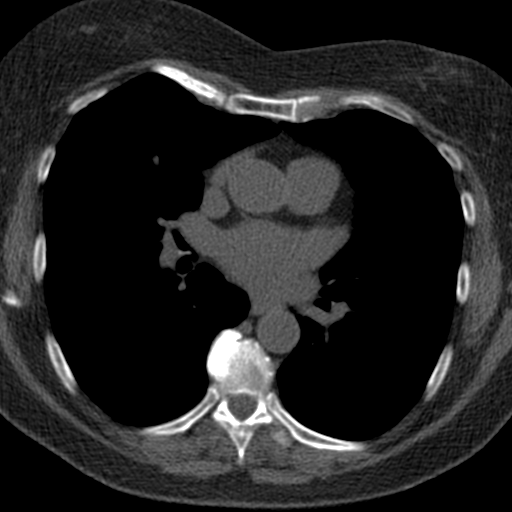
[im 37/45  vessel]
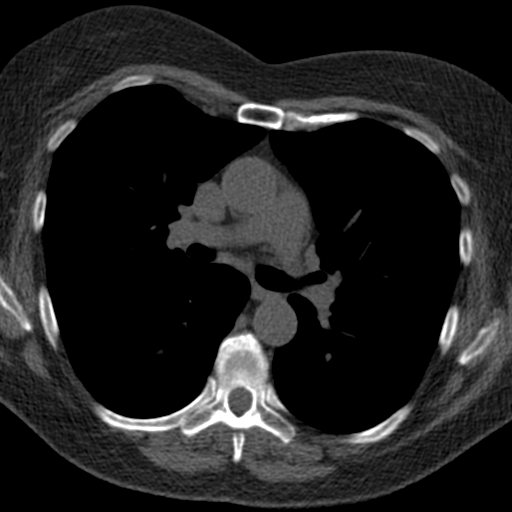
[im 37/45  lung]
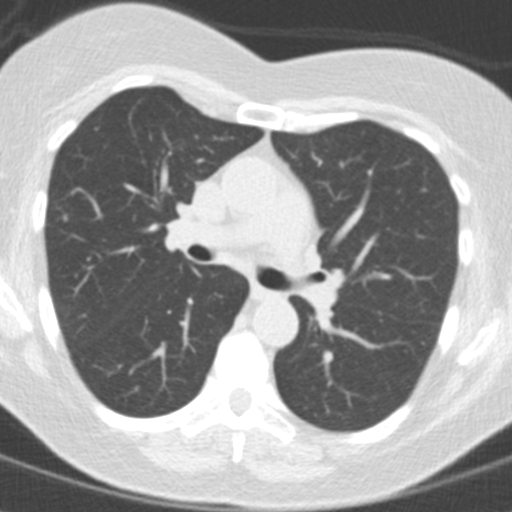

[Series 4: full fov lung · axial · 0.52mm/px · z∈[-214,-127]mm · 5 of 45 slices shown]
[im 8/45  lung]
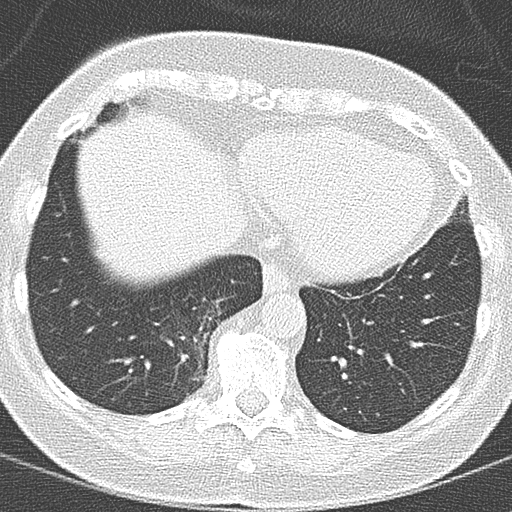
[im 15/45  lung]
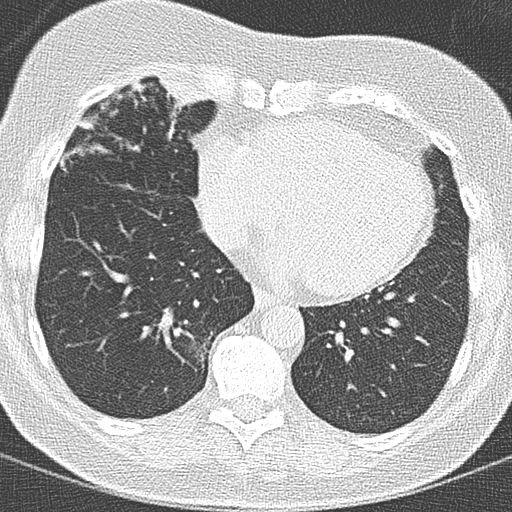
[im 23/45  lung]
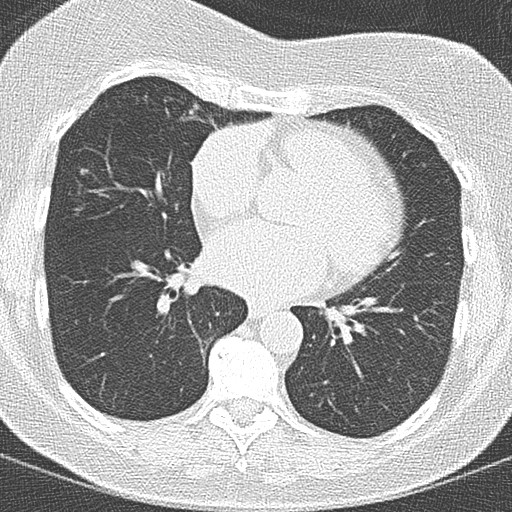
[im 30/45  lung]
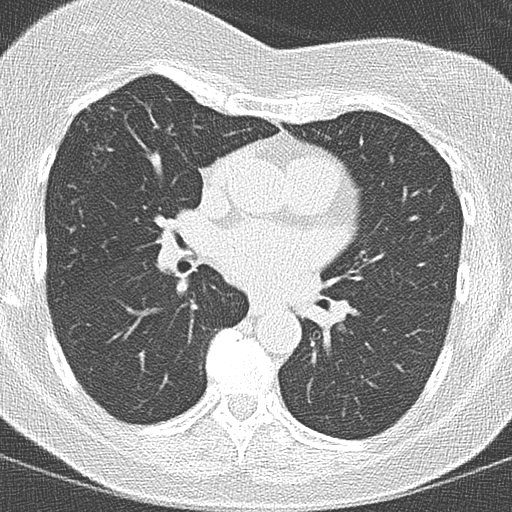
[im 37/45  lung]
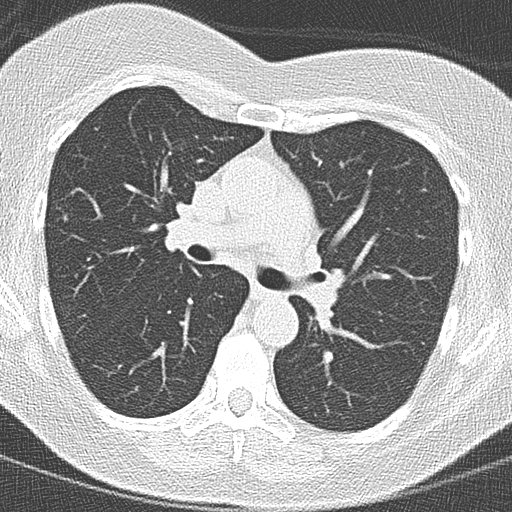

[14 of 20 positions shown; findings below may reference images not displayed]

FINDINGS: Vascular: No significant noncardiac vascular findings.

Mediastinum/Nodes: Visualized mediastinum and hilar regions
demonstrate no lymphadenopathy or masses.

Lungs/Pleura: Nodularity in a bronchovascular distribution extending
to the subpleural surface of the right middle lobe and to a lesser
degree along the inferolateral aspect of the right upper lobe.
Findings have the appearance of postinflammatory nodularity.
Visualized lungs show no evidence of pulmonary edema, focal
consolidation, pneumothorax or pleural fluid.

Upper Abdomen: No acute abnormality.

Musculoskeletal: No chest wall mass or suspicious bone lesions
identified.
IMPRESSION: Bronchovascular nodularity in the right middle lobe and inferior
right upper lobe. Findings have the appearance of postinflammatory
nodularity.
FINDINGS: Coronary arteries: Normal origins.

Coronary Calcium Score:

Left main: 0

Left anterior descending artery: 0

Left circumflex artery: 0

Right coronary artery: 0

Total: 0

Pericardium: Normal.

Ascending Aorta: Normal caliber.

Non-cardiac: See separate report from [REDACTED].
IMPRESSION: Coronary calcium score of 0 Agatston units. This suggests low risk
for future cardiac events.



If CAC=0, it is reasonable to withhold statin therapy and reassess
in 5 to 10 years, as long as higher risk conditions are absent
(diabetes mellitus, family history of premature CHD in first degree
relatives (males <55 years; females <65 years), cigarette smoking,
or LDL >=190 mg/dL).

If CAC is 1 to 99, it is reasonable to initiate statin therapy for
patients >=55 years of age.

If CAC is >=100 or >=75th percentile, it is reasonable to initiate
statin therapy at any age.

Cardiology referral should be considered for patients with CAC
scores >=400 or >=75th percentile.

*1811 AHA/ACC/AACVPR/AAPA/ABC/HIRORI/GLENFORD/DATABEX/Palnivel/HAIFO/NOMASIBULELE/PRELUNA
Guideline on the Management of Blood Cholesterol: A Report of the
American College of Cardiology/American Heart Association Task Force
on Clinical Practice Guidelines. J Am Coll Cardiol.
8643;73(24):8956-8746.

*** End of Addendum ***
EXAM:
OVER-READ INTERPRETATION  CT CHEST

The following report is an over-read performed by radiologist Dr.
over-read does not include interpretation of cardiac or coronary
anatomy or pathology. The coronary calcium score interpretation by
the cardiologist is attached.
FINDINGS: Vascular: No significant noncardiac vascular findings.

Mediastinum/Nodes: Visualized mediastinum and hilar regions
demonstrate no lymphadenopathy or masses.

Lungs/Pleura: Nodularity in a bronchovascular distribution extending
to the subpleural surface of the right middle lobe and to a lesser
degree along the inferolateral aspect of the right upper lobe.
Findings have the appearance of postinflammatory nodularity.
Visualized lungs show no evidence of pulmonary edema, focal
consolidation, pneumothorax or pleural fluid.

Upper Abdomen: No acute abnormality.

Musculoskeletal: No chest wall mass or suspicious bone lesions
identified.
IMPRESSION: Bronchovascular nodularity in the right middle lobe and inferior
right upper lobe. Findings have the appearance of postinflammatory
nodularity.

## 2023-06-13 ENCOUNTER — Other Ambulatory Visit: Payer: Self-pay | Admitting: Cardiology

## 2023-06-30 NOTE — Progress Notes (Signed)
HPI: FU history of question of mitral valve prolapse, as well as supraventricular tachycardia that was diagnosed in the 1970s. She apparently had a nonischemic stress Myoview performed in 1999. Echocardiogram repeated in September 2017 and showed normal LV systolic function, grade 2 diastolic dysfunction, no mitral valve prolapse with trace mitral regurgitation and mild left atrial enlargement.  Calcium score February 2023 0.  Since I last saw her, she denies dyspnea, chest pain, palpitations or syncope.  Current Outpatient Medications  Medication Sig Dispense Refill   alendronate (FOSAMAX) 70 MG tablet Take 70 mg by mouth once a week.     aspirin 81 MG EC tablet Take 81 mg by mouth daily.     Cholecalciferol (VITAMIN D3 PO) Take 2,000 Units by mouth daily.     fluticasone (FLONASE) 50 MCG/ACT nasal spray Place 2 sprays into both nostrils as needed.     Omega-3 Fatty Acids (FISH OIL) 1200 MG CAPS Take by mouth daily.     propranolol (INDERAL) 20 MG tablet Take 1 tablet (20 mg total) by mouth 2 (two) times daily. 180 tablet 3   simvastatin (ZOCOR) 40 MG tablet TAKE ONE TABLET BY MOUTH EVERY EVENING 90 tablet 0   vitamin C (ASCORBIC ACID) 500 MG tablet Take 500 mg by mouth daily.     Zinc 50 MG TABS Take 1 tablet by mouth daily in the afternoon.     No current facility-administered medications for this visit.     Past Medical History:  Diagnosis Date   Hyperlipidemia    mixed   Mitral regurgitation    Mitral valve prolapse    SVT (supraventricular tachycardia) (HCC)     History reviewed. No pertinent surgical history.  Social History   Socioeconomic History   Marital status: Married    Spouse name: Not on file   Number of children: Not on file   Years of education: Not on file   Highest education level: Not on file  Occupational History   Not on file  Tobacco Use   Smoking status: Never   Smokeless tobacco: Never  Vaping Use   Vaping status: Never Used  Substance and  Sexual Activity   Alcohol use: Yes   Drug use: No   Sexual activity: Not on file  Other Topics Concern   Not on file  Social History Narrative   Not on file   Social Drivers of Health   Financial Resource Strain: Low Risk  (01/25/2023)   Received from [facility]   Overall Financial Resource Strain (CARDIA)    Difficulty of Paying Living Expenses: Not hard at all  Food Insecurity: No Food Insecurity (01/25/2023)   Received from [facility]   Hunger Vital Sign    Worried About Running Out of Food in the Last Year: Never true    Ran Out of Food in the Last Year: Never true  Transportation Needs: No Transportation Needs (01/25/2023)   Received from [facility] - Transportation    Lack of Transportation (Medical): No    Lack of Transportation (Non-Medical): No  Physical Activity: Sufficiently Active (01/25/2023)   Received from [facility]   Exercise Vital Sign    Days of Exercise per Week: 4 days    Minutes of Exercise per Session: 40 min  Stress: No Stress Concern Present (01/25/2023)   Received from [facility] - Occupational Stress Questionnaire    Feeling of Stress : Only  a little  Social Connections: Socially Integrated (01/25/2023)   Received from [facility]   Social Network    How would you rate your social network (family, work, friends)?: Good participation with social networks  Intimate Partner Violence: Not At Risk (01/25/2023)   Received from [facility]   HITS    Over the last 12 months how often did your partner physically hurt you?: Never    Over the last 12 months how often did your partner insult you or talk down to you?: Never    Over the last 12 months how often did your partner threaten you with physical harm?: Never    Over the last 12 months how often did your partner scream or curse at you?: Never    Family History  Problem Relation Age of Onset   Lupus Mother    Osteoarthritis Mother     Heart failure Mother    Heart failure Father     ROS: no fevers or chills, productive cough, hemoptysis, dysphasia, odynophagia, melena, hematochezia, dysuria, hematuria, rash, seizure activity, orthopnea, PND, pedal edema, claudication. Remaining systems are negative.  Physical Exam: Well-developed well-nourished in no acute distress.  Skin is warm and dry.  HEENT is normal.  Neck is supple.  Chest is clear to auscultation with normal expansion.  Cardiovascular exam is regular rate and rhythm.  Abdominal exam nontender or distended. No masses palpated. Extremities show no edema. neuro grossly intact  EKG Interpretation Date/Time:  Monday July 12 2023 13:15:37 EST Ventricular Rate:  47 PR Interval:  158 QRS Duration:  80 QT Interval:  454 QTC Calculation: 401 R Axis:   82  Text Interpretation: Sinus bradycardia No previous ECGs available Confirmed by Olga Millers ([id number]) on 07/12/2023 1:16:43 PM    A/P  1 history of SVT-patient has had no recurrences since most recent office visit.  However her heart rate is mildly decreased.  Will decrease propranolol to 10 mg twice daily and follow.  2 hyperlipidemia-continue statin.  Last LDL 74 and liver functions normal.  3 history of palpitations-symptoms are controlled.  Will continue beta-blocker but decrease dose as outlined.  4 history of mitral valve prolapse-not evident on most recent echocardiogram.  Olga Millers, MD

## 2023-07-12 ENCOUNTER — Ambulatory Visit: Payer: Medicare PPO | Admitting: Cardiology

## 2023-07-12 ENCOUNTER — Encounter: Payer: Self-pay | Admitting: Cardiology

## 2023-07-12 VITALS — BP 140/80 | HR 47 | Ht 64.0 in | Wt 156.0 lb

## 2023-07-12 DIAGNOSIS — R002 Palpitations: Secondary | ICD-10-CM

## 2023-07-12 DIAGNOSIS — I471 Supraventricular tachycardia, unspecified: Secondary | ICD-10-CM | POA: Diagnosis not present

## 2023-07-12 DIAGNOSIS — E782 Mixed hyperlipidemia: Secondary | ICD-10-CM | POA: Diagnosis not present

## 2023-07-12 MED ORDER — SIMVASTATIN 40 MG PO TABS: 40.0000 mg | ORAL_TABLET | Freq: Every evening | ORAL | 3 refills | Status: DC

## 2023-07-12 MED ORDER — PROPRANOLOL HCL 10 MG PO TABS: 10.0000 mg | ORAL_TABLET | Freq: Two times a day (BID) | ORAL | 3 refills | Status: DC

## 2023-07-12 NOTE — Patient Instructions (Signed)
Medication Instructions:   DECREASE PROPRANOLOL TO 10 MG TWICE DAILY=1/2 OF THE 20 MG TABLET TWICE DAILY  *If you need a refill on your cardiac medications before your next appointment, please call your pharmacy*   Follow-Up: At [facility], you and your health needs are our priority.  As part of our continuing mission to provide you with exceptional heart care, we have created designated Provider Care Teams.  These Care Teams include your primary Cardiologist (physician) and Advanced Practice Providers (APPs -  Physician Assistants and Nurse Practitioners) who all work together to provide you with the care you need, when you need it.   Your next appointment:   12 month(s)  Provider:   Olga Millers, MD

## 2023-08-01 ENCOUNTER — Other Ambulatory Visit: Payer: Self-pay | Admitting: Nurse Practitioner

## 2024-07-12 ENCOUNTER — Ambulatory Visit: Admitting: Cardiology

## 2024-07-12 ENCOUNTER — Encounter: Payer: Self-pay | Admitting: Cardiology

## 2024-07-12 VITALS — BP 140/70 | HR 69 | Ht 63.0 in | Wt 156.0 lb

## 2024-07-12 DIAGNOSIS — I471 Supraventricular tachycardia, unspecified: Secondary | ICD-10-CM | POA: Diagnosis not present

## 2024-07-12 DIAGNOSIS — R002 Palpitations: Secondary | ICD-10-CM

## 2024-07-12 DIAGNOSIS — E782 Mixed hyperlipidemia: Secondary | ICD-10-CM | POA: Diagnosis not present

## 2024-07-12 MED ORDER — SIMVASTATIN 40 MG PO TABS: 40.0000 mg | ORAL_TABLET | Freq: Every evening | ORAL | 3 refills | Status: AC

## 2024-07-12 MED ORDER — PROPRANOLOL HCL 10 MG PO TABS: 10.0000 mg | ORAL_TABLET | Freq: Two times a day (BID) | ORAL | 3 refills | Status: AC

## 2024-07-12 NOTE — Patient Instructions (Signed)
